# Patient Record
Sex: Male | Born: 1950 | Hispanic: No | Smoking: Never smoker
Health system: Southern US, Community
[De-identification: ages and names within clinical notes are randomized; demographics above are authoritative.]

## PROBLEM LIST (undated history)

## (undated) DIAGNOSIS — E785 Hyperlipidemia, unspecified: Secondary | ICD-10-CM

## (undated) DIAGNOSIS — I10 Essential (primary) hypertension: Secondary | ICD-10-CM

## (undated) DIAGNOSIS — N400 Enlarged prostate without lower urinary tract symptoms: Secondary | ICD-10-CM

## (undated) DIAGNOSIS — E039 Hypothyroidism, unspecified: Secondary | ICD-10-CM

## (undated) DIAGNOSIS — M1990 Unspecified osteoarthritis, unspecified site: Secondary | ICD-10-CM

## (undated) HISTORY — DX: Unspecified osteoarthritis, unspecified site: M19.90

## (undated) HISTORY — PX: CARPAL TUNNEL RELEASE: SHX101

## (undated) HISTORY — DX: Hyperlipidemia, unspecified: E78.5

---

## 2000-04-04 ENCOUNTER — Encounter: Payer: Self-pay | Admitting: Emergency Medicine

## 2000-04-04 ENCOUNTER — Emergency Department (HOSPITAL_COMMUNITY): Admission: EM | Admit: 2000-04-04 | Discharge: 2000-04-04 | Payer: Self-pay | Admitting: Emergency Medicine

## 2005-05-06 ENCOUNTER — Ambulatory Visit: Payer: Self-pay | Admitting: Internal Medicine

## 2017-09-12 ENCOUNTER — Encounter: Payer: Self-pay | Admitting: Physician Assistant

## 2017-09-12 ENCOUNTER — Ambulatory Visit (INDEPENDENT_AMBULATORY_CARE_PROVIDER_SITE_OTHER): Payer: Medicare Other | Admitting: Physician Assistant

## 2017-09-12 ENCOUNTER — Ambulatory Visit (INDEPENDENT_AMBULATORY_CARE_PROVIDER_SITE_OTHER): Payer: Medicare Other

## 2017-09-12 VITALS — BP 130/70 | HR 58 | Temp 98.0°F | Resp 16 | Ht 64.5 in | Wt 156.2 lb

## 2017-09-12 DIAGNOSIS — Z23 Encounter for immunization: Secondary | ICD-10-CM | POA: Diagnosis not present

## 2017-09-12 DIAGNOSIS — N5089 Other specified disorders of the male genital organs: Secondary | ICD-10-CM

## 2017-09-12 DIAGNOSIS — K5730 Diverticulosis of large intestine without perforation or abscess without bleeding: Secondary | ICD-10-CM | POA: Diagnosis not present

## 2017-09-12 DIAGNOSIS — E279 Disorder of adrenal gland, unspecified: Secondary | ICD-10-CM

## 2017-09-12 DIAGNOSIS — N2889 Other specified disorders of kidney and ureter: Secondary | ICD-10-CM | POA: Diagnosis not present

## 2017-09-12 DIAGNOSIS — E278 Other specified disorders of adrenal gland: Secondary | ICD-10-CM | POA: Insufficient documentation

## 2017-09-12 NOTE — Patient Instructions (Addendum)
You are being referred to [facility].  I expect that you will be seen within 30 days.  Please keep your phone one and check you messages so you get in touch with their office once they call.     IF you received an x-ray today, you will receive an invoice from [facility]. Please contact [facility] at [phone] with questions or concerns regarding your invoice.   IF you received labwork today, you will receive an invoice from [facility]. Please contact [facility] at [phone] with questions or concerns regarding your invoice.   Our billing staff will not be able to assist you with questions regarding bills from these companies.  You will be contacted with the lab results as soon as they are available. The fastest way to get your results is to activate your My Chart account. Instructions are located on the last page of this paperwork. If you have not heard from [place] regarding the results in 2 weeks, please contact this office.

## 2017-09-12 NOTE — Progress Notes (Signed)
    09/12/2017 1:45 PM   DOB: 07-10-1951 / MRN: [id number]  SUBJECTIVE:  Glenn Lewis is a 66 y.o. male presenting for renal mass.  New patient to the office and the are. No weight loss. Never smoker. Sister breast cancer. He is eating well. Negative chest xray in 10/2016. Previous ultrasounds show testicular cyst. See below.   He has No Known Allergies.   He  has no past medical history on file.    He  reports that he has quit smoking. He has never used smokeless tobacco. He reports that he drinks alcohol. He reports that he does not use drugs. He  has no sexual activity history on file. The patient  has no past surgical history on file.  His family history is not on file.  Review of Systems  Constitutional: Negative for chills, diaphoresis and fever.  Eyes: Negative.   Respiratory: Negative for cough, hemoptysis, sputum production, shortness of breath and wheezing.   Cardiovascular: Negative for chest pain, orthopnea and leg swelling.  Gastrointestinal: Negative for abdominal pain, blood in stool, constipation, diarrhea, heartburn, melena, nausea and vomiting.  Genitourinary: Negative for flank pain.  Skin: Negative for rash.  Neurological: Negative for dizziness, sensory change, speech change, focal weakness and headaches.    The problem list and medications were reviewed and updated by myself where necessary and exist elsewhere in the encounter.   OBJECTIVE:  BP 130/70 (BP Location: Right Arm, Patient Position: Sitting, Cuff Size: Normal)   Pulse (!) 58   Temp 98 F (36.7 C) (Oral)   Resp 16   Ht 5' 4.5" (1.638 m)   Wt 156 lb 3.2 oz (70.9 kg)   SpO2 96%   BMI 26.40 kg/m   Physical Exam  Constitutional: He appears well-developed. He is active and cooperative.  Non-toxic appearance.  Cardiovascular: Normal rate.   Pulmonary/Chest: Effort normal. No tachypnea.  Neurological: He is alert.  Skin: Skin is warm and dry. He is not diaphoretic. No pallor.  Vitals  reviewed.            No results found for this or any previous visit (from the past 72 hour(s)).  No results found.  ASSESSMENT AND PLAN:  Siyon was seen today for referral.  Diagnoses and all orders for this visit:  Need for prophylactic vaccination and inoculation against influenza -     Cancel: Flu Vaccine QUAD 36+ mos IM  Renal mass -     DG Chest 2 View; Future  Diverticulosis of colon without hemorrhage: Historical.   Adrenal mass [facility]): He will likely need a biopsy.  Will start with urology to get a diagnosis and will refer to endo iff needed.  -     CBC with Differential -     Urinalysis, dipstick only -     CMP and Liver    The patient is advised to call or return to clinic if he does not see an improvement in symptoms, or to seek the care of the closest emergency department if he worsens with the above plan.   Philis Fendt, MHS, PA-C [facility] 09/12/2017 1:45 PM

## 2017-09-13 LAB — CMP AND LIVER
ALT: 12 IU/L (ref 0–44)
AST: 14 IU/L (ref 0–40)
Albumin: 3.8 g/dL (ref 3.6–4.8)
Alkaline Phosphatase: 46 IU/L (ref 39–117)
BUN: 13 mg/dL (ref 8–27)
Bilirubin Total: <0.2 mg/dL (ref 0.0–1.2)
Bilirubin, Direct: 0.06 mg/dL (ref 0.00–0.40)
CO2: 24 mmol/L (ref 20–29)
Calcium: 9.3 mg/dL (ref 8.6–10.2)
Chloride: 98 mmol/L (ref 96–106)
Creatinine, Ser: 0.82 mg/dL (ref 0.76–1.27)
GFR calc Af Amer: 106 mL/min/{1.73_m2} (ref >59)
GFR calc non Af Amer: 92 mL/min/{1.73_m2} (ref >59)
Glucose: 114 mg/dL — ABNORMAL HIGH (ref 65–99)
Potassium: 5 mmol/L (ref 3.5–5.2)
Sodium: 138 mmol/L (ref 134–144)
Total Protein: 8.4 g/dL (ref 6.0–8.5)

## 2017-09-13 LAB — CBC WITH DIFFERENTIAL/PLATELET
Basophils Absolute: 0 10*3/uL (ref 0.0–0.2)
Basos: 0 %
EOS (ABSOLUTE): 0.4 10*3/uL (ref 0.0–0.4)
Eos: 6 %
Hematocrit: 38.3 % (ref 37.5–51.0)
Hemoglobin: 12.4 g/dL — ABNORMAL LOW (ref 13.0–17.7)
Immature Grans (Abs): 0 10*3/uL (ref 0.0–0.1)
Immature Granulocytes: 0 %
Lymphocytes Absolute: 2.2 10*3/uL (ref 0.7–3.1)
Lymphs: 36 %
MCH: 30.4 pg (ref 26.6–33.0)
MCHC: 32.4 g/dL (ref 31.5–35.7)
MCV: 94 fL (ref 79–97)
Monocytes Absolute: 0.7 10*3/uL (ref 0.1–0.9)
Monocytes: 12 %
Neutrophils Absolute: 2.8 10*3/uL (ref 1.4–7.0)
Neutrophils: 46 %
Platelets: 243 10*3/uL (ref 150–379)
RBC: 4.08 x10E6/uL — ABNORMAL LOW (ref 4.14–5.80)
RDW: 15 % (ref 12.3–15.4)
WBC: 6 10*3/uL (ref 3.4–10.8)

## 2017-10-27 ENCOUNTER — Ambulatory Visit (INDEPENDENT_AMBULATORY_CARE_PROVIDER_SITE_OTHER): Payer: Medicare Other

## 2017-10-27 ENCOUNTER — Encounter: Payer: Self-pay | Admitting: Physician Assistant

## 2017-10-27 ENCOUNTER — Ambulatory Visit (INDEPENDENT_AMBULATORY_CARE_PROVIDER_SITE_OTHER): Payer: Medicare Other | Admitting: Physician Assistant

## 2017-10-27 VITALS — BP 126/72 | HR 57 | Temp 98.4°F | Resp 18 | Ht 65.98 in | Wt 161.8 lb

## 2017-10-27 DIAGNOSIS — R059 Cough, unspecified: Secondary | ICD-10-CM

## 2017-10-27 DIAGNOSIS — R05 Cough: Secondary | ICD-10-CM

## 2017-10-27 DIAGNOSIS — R0981 Nasal congestion: Secondary | ICD-10-CM

## 2017-10-27 DIAGNOSIS — R062 Wheezing: Secondary | ICD-10-CM

## 2017-10-27 DIAGNOSIS — J9801 Acute bronchospasm: Secondary | ICD-10-CM

## 2017-10-27 LAB — POCT CBC
GRANULOCYTE PERCENT: 54.6 % (ref 37–80)
HCT, POC: 39.5 % — AB (ref 43.5–53.7)
HEMOGLOBIN: 13.5 g/dL — AB (ref 14.1–18.1)
Lymph, poc: 1.9 (ref 0.6–3.4)
MCH: 31.7 pg — AB (ref 27–31.2)
MCHC: 34.2 g/dL (ref 31.8–35.4)
MCV: 92.8 fL (ref 80–97)
MID (cbc): 0.4 (ref 0–0.9)
MPV: 8.1 fL (ref 0–99.8)
PLATELET COUNT, POC: 237 10*3/uL (ref 142–424)
POC Granulocyte: 2.8 (ref 2–6.9)
POC LYMPH PERCENT: 36.9 %L (ref 10–50)
POC MID %: 8.5 % (ref 0–12)
RBC: 4.25 M/uL — AB (ref 4.69–6.13)
RDW, POC: 13.9 %
WBC: 5.2 10*3/uL (ref 4.6–10.2)

## 2017-10-27 MED ORDER — CETIRIZINE HCL 10 MG PO TABS: 10.0000 mg | ORAL_TABLET | Freq: Every day | ORAL | 11 refills | Status: DC

## 2017-10-27 MED ORDER — ALBUTEROL SULFATE (2.5 MG/3ML) 0.083% IN NEBU
2.5000 mg | INHALATION_SOLUTION | Freq: Once | RESPIRATORY_TRACT | Status: AC
  Administered 2017-10-27: 2.5 mg via RESPIRATORY_TRACT

## 2017-10-27 MED ORDER — FLUTICASONE PROPIONATE 50 MCG/ACT NA SUSP: 2.0000 | Freq: Every day | NASAL | 0 refills | Status: DC

## 2017-10-27 MED ORDER — ALBUTEROL SULFATE HFA 108 (90 BASE) MCG/ACT IN AERS: 2.0000 | INHALATION_SPRAY | Freq: Four times a day (QID) | RESPIRATORY_TRACT | 0 refills | Status: DC | PRN

## 2017-10-27 MED ORDER — IPRATROPIUM BROMIDE 0.02 % IN SOLN
0.5000 mg | Freq: Once | RESPIRATORY_TRACT | Status: AC
  Administered 2017-10-27: 0.5 mg via RESPIRATORY_TRACT

## 2017-10-27 MED ORDER — PREDNISONE 20 MG PO TABS: 40.0000 mg | ORAL_TABLET | Freq: Every day | ORAL | 0 refills | Status: AC

## 2017-10-27 NOTE — Progress Notes (Signed)
Glenn Lewis  MRN: [id number] DOB: [birth year]  Subjective:  Glenn Lewis is a 66 y.o. male seen in office today for a chief complaint of gradually improving dry cough, which is worse in the morning. Will occasionally have heartburn after eating spicy.  Also endorses some nasal congestion. Has occasional wheezing. Denies SOB,hemoptysis, night sweats, chest pain, fever, chills, diaphoresis, sore throat, sinus pressure, sneezing, ear pain, and itchy watery eyes. Has tried OTC mucinex and mucinex with no relief. Denies smoking and alcohol use. Denies recent travel. No hx of asthma or COPD.  Of note patient did move here from [place] exactly 2 months ago and that is when the coughing and nasal congestion started.  Review of Systems  Constitutional: Negative for activity change and unexpected weight change.  HENT: Negative for trouble swallowing and voice change.   Cardiovascular: Negative for chest pain, palpitations and leg swelling.  Gastrointestinal: Negative for abdominal pain, nausea and vomiting.  Neurological: Negative for dizziness and light-headedness.     Patient Active Problem List   Diagnosis Date Noted  . Diverticulosis of colon without hemorrhage 09/12/2017  . Adrenal mass ([place]) 09/12/2017    Current Outpatient Medications on File Prior to Visit  Medication Sig Dispense Refill  . levothyroxine (SYNTHROID, LEVOTHROID) 75 MCG tablet Take 75 mcg by mouth daily before breakfast.    . oxybutynin (DITROPAN-XL) 10 MG 24 hr tablet Take 10 mg by mouth at bedtime.    . tamsulosin (FLOMAX) 0.4 MG CAPS capsule Take 0.4 mg by mouth.     No current facility-administered medications on file prior to visit.     No Known Allergies    Social History   Socioeconomic History  . Marital status: Married    Spouse name: Not on file  . Number of children: Not on file  . Years of education: Not on file  . Highest education level: Not on file  Social Needs  . Financial resource  strain: Not on file  . Food insecurity - worry: Not on file  . Food insecurity - inability: Not on file  . Transportation needs - medical: Not on file  . Transportation needs - non-medical: Not on file  Occupational History  . Not on file  Tobacco Use  . Smoking status: Former Research scientist (life sciences)  . Smokeless tobacco: Never Used  Substance and Sexual Activity  . Alcohol use: Yes    Comment: sometimes  . Drug use: No  . Sexual activity: Not on file  Other Topics Concern  . Not on file  Social History Narrative  . Not on file    Objective:  BP 126/72 (BP Location: Left Arm, Patient Position: Sitting, Cuff Size: Normal)   Pulse (!) 57   Temp 98.4 F (36.9 C) (Oral)   Resp 18   Ht 5' 5.98" (1.676 m)   Wt 161 lb 12.8 oz (73.4 kg)   SpO2 99%   BMI 26.13 kg/m   Physical Exam  Constitutional: He is oriented to person, place, and time and well-developed, well-nourished, and in no distress.  HENT:  Head: Normocephalic and atraumatic.  Right Ear: Tympanic membrane, external ear and ear canal normal.  Left Ear: Tympanic membrane, external ear and ear canal normal.  Nose: Mucosal edema (moderate in right, severe in left) present. No rhinorrhea. Right sinus exhibits no maxillary sinus tenderness and no frontal sinus tenderness. Left sinus exhibits no maxillary sinus tenderness and no frontal sinus tenderness.  Mouth/Throat: Uvula is midline and mucous membranes are  normal. Posterior oropharyngeal erythema (cobblestoning noted) present.  Eyes: Conjunctivae are normal.  Neck: Normal range of motion.  Cardiovascular: Normal rate, regular rhythm and normal heart sounds.  Pulmonary/Chest: Effort normal. No respiratory distress. He has wheezes (diffuse intermittent). He has no rhonchi. He has no rales.  Lymphadenopathy:       Head (right side): No submental, no submandibular, no tonsillar, no preauricular, no posterior auricular and no occipital adenopathy present.       Head (left side): No submental,  no submandibular, no tonsillar, no preauricular, no posterior auricular and no occipital adenopathy present.    He has no cervical adenopathy.       Right: No supraclavicular adenopathy present.       Left: No supraclavicular adenopathy present.  Neurological: He is alert and oriented to person, place, and time. Gait normal.  Skin: Skin is warm and dry.  Psychiatric: Affect normal.  Vitals reviewed.  Wt Readings from Last 3 Encounters:  10/27/17 161 lb 12.8 oz (73.4 kg)  09/12/17 156 lb 3.2 oz (70.9 kg)    Results for orders placed or performed in visit on 10/27/17 (from the past 24 hour(s))  POCT CBC     Status: Abnormal   Collection Time: 10/27/17 10:00 AM  Result Value Ref Range   WBC 5.2 4.6 - 10.2 K/uL   Lymph, poc 1.9 0.6 - 3.4   POC LYMPH PERCENT 36.9 10 - 50 %L   MID (cbc) 0.4 0 - 0.9   POC MID % 8.5 0 - 12 %M   POC Granulocyte 2.8 2 - 6.9   Granulocyte percent 54.6 37 - 80 %G   RBC 4.25 (A) 4.69 - 6.13 M/uL   Hemoglobin 13.5 (A) 14.1 - 18.1 g/dL   HCT, POC 39.5 (A) 43.5 - 53.7 %   MCV 92.8 80 - 97 fL   MCH, POC 31.7 (A) 27 - 31.2 pg   MCHC 34.2 31.8 - 35.4 g/dL   RDW, POC 13.9 %   Platelet Count, POC 237 142 - 424 K/uL   MPV 8.1 0 - 99.8 fL   Dg Chest 2 View  Result Date: 10/27/2017 CLINICAL DATA:  Cough for 2 months, diffuse intermittent wheezing on exam, former smoker EXAM: CHEST  2 VIEW COMPARISON:  09/12/2017 FINDINGS: Normal heart size, mediastinal contours, and pulmonary vascularity. Lungs well inflated and clear. No acute infiltrate, pleural effusion or pneumothorax. Multilevel endplate spur formation thoracic spine. IMPRESSION: No acute abnormalities. Electronically Signed   By: Lavonia Dana M.D.   On: 10/27/2017 09:57   Wheezing improved after breathing treatment. Lungs CTAB.  Assessment and Plan :  1. Cough Plain films show no acute abnormalities.  WBC within normal limits.  Do not suggest bacterial etiology at this time. - DG Chest 2 View; Future - POCT  CBC  2. Wheezing Improved with breathing treatment. - albuterol (PROVENTIL) (2.5 MG/3ML) 0.083% nebulizer solution 2.5 mg - ipratropium (ATROVENT) nebulizer solution 0.5 mg  3. Bronchospasm History suspicious for irritant induced bronchospasm.  Patient moved here 2 months ago from [place] and that is when his symptoms began.  He has associated nasal congestion and physical exam findings are consistent with allergic rhinitis.  He has mucosal edema and cobblestoning in throat.  We will treat at this time with prednisone for wheezing.  Given Rx for albuterol inhaler to use every 4-6 hours as needed for wheezing.  Also given Rx for daily antihistamine and Flonase to use for possible underlying allergies. Return to  clinic if symptoms worsen, do not improve, or as needed.  4. Nasal congestion   Meds ordered this encounter  Medications  . albuterol (PROVENTIL) (2.5 MG/3ML) 0.083% nebulizer solution 2.5 mg  . ipratropium (ATROVENT) nebulizer solution 0.5 mg  . predniSONE (DELTASONE) 20 MG tablet    Sig: Take 2 tablets (40 mg total) daily with breakfast for 5 days by mouth.    Dispense:  10 tablet    Refill:  0    Order Specific Question:   Supervising Provider    Answer:   Wardell Honour [[id number]]  . albuterol (PROVENTIL HFA;VENTOLIN HFA) 108 (90 Base) MCG/ACT inhaler    Sig: Inhale 2 puffs every 6 (six) hours as needed into the lungs for wheezing or shortness of breath.    Dispense:  1 Inhaler    Refill:  0    Order Specific Question:   Supervising Provider    Answer:   Wardell Honour [[id number]]  . cetirizine (ZYRTEC) 10 MG tablet    Sig: Take 1 tablet (10 mg total) daily by mouth.    Dispense:  30 tablet    Refill:  11    Order Specific Question:   Supervising Provider    Answer:   Wardell Honour [[id number]]  . fluticasone (FLONASE) 50 MCG/ACT nasal spray    Sig: Place 2 sprays daily into both nostrils.    Dispense:  16 g    Refill:  0    Order Specific Question:   Supervising Provider     Answer:   Reginia Forts M [[id number]]    Tenna Delaine PA-C  [facility] [place] 10/27/2017 10:07 AM

## 2017-10-27 NOTE — Patient Instructions (Addendum)
Your cough is likely due to you having bronchospasms.  This could be due to irritants in the environment as you are also having nasal congestion.  I am going to treat your underlying inflammation in your lungs with prednisone.  I am going to treat any potential allergies with oral antihistamine and nasal steroid.  Please use as prescribed.  You may also use the albuterol inhaler every 4-6 hours as needed if you have any wheezing.  Prednisone is a steroid and can cause side effects such as headache, irritability, nausea, vomiting, increased heart rate, increased blood pressure, increased blood sugar, appetite changes, and insomnia. Please take tablets in the morning with a full meal to help decrease the chances of these side effects.   Return to clinic if symptoms worsen, do not improve in 7-10 days, or as needed.      IF you received an x-ray today, you will receive an invoice from [facility]. Please contact [facility] at [phone] with questions or concerns regarding your invoice.   IF you received labwork today, you will receive an invoice from [place]. Please contact [facility] at [phone] with questions or concerns regarding your invoice.   Our billing staff will not be able to assist you with questions regarding bills from these companies.  You will be contacted with the lab results as soon as they are available. The fastest way to get your results is to activate your My Chart account. Instructions are located on the last page of this paperwork. If you have not heard from [place] regarding the results in 2 weeks, please contact this office.

## 2017-12-30 ENCOUNTER — Other Ambulatory Visit: Payer: Self-pay | Admitting: Urology

## 2017-12-30 DIAGNOSIS — D3501 Benign neoplasm of right adrenal gland: Secondary | ICD-10-CM

## 2017-12-31 ENCOUNTER — Telehealth: Payer: Self-pay | Admitting: Physician Assistant

## 2017-12-31 NOTE — Telephone Encounter (Signed)
Called to remind pt about their appt tomorrow 01/01/18 °

## 2018-01-01 ENCOUNTER — Other Ambulatory Visit: Payer: Self-pay

## 2018-01-01 ENCOUNTER — Ambulatory Visit (INDEPENDENT_AMBULATORY_CARE_PROVIDER_SITE_OTHER): Payer: Medicare Other | Admitting: Physician Assistant

## 2018-01-01 ENCOUNTER — Encounter: Payer: Self-pay | Admitting: Physician Assistant

## 2018-01-01 VITALS — BP 110/64 | HR 79 | Temp 98.1°F | Resp 18 | Ht 65.98 in | Wt 168.0 lb

## 2018-01-01 DIAGNOSIS — E039 Hypothyroidism, unspecified: Secondary | ICD-10-CM

## 2018-01-01 DIAGNOSIS — R739 Hyperglycemia, unspecified: Secondary | ICD-10-CM

## 2018-01-01 LAB — GLUCOSE, POCT (MANUAL RESULT ENTRY): POC GLUCOSE: 116 mg/dL — AB (ref 70–99)

## 2018-01-01 NOTE — Progress Notes (Signed)
Glenn Lewis  MRN: [id number] DOB: 05/24/1951  Subjective:  Glenn Lewis is a 67 y.o. male seen in office today for a chief complaint of lab check.  Patient has history of hypothyroidism about 4 years ago.  Was on Synthroid 75 mcg daily.  Has not taken this in about 2 months.  He is wondering if he still needs to be on medication.  There are no values of TSH in our system.  Denies fatigue, hair loss, dry skin, chest pain, heart palpitations, heat or cold intolerance.  He does have some mild constipation.  Notes he used to have a bowel movement every day but now only has 1 every couple days.  He would also like to have his sugars checked because he has been told the past that he has elevated sugars.  He is fasting today.  He is not sure if he has a family history of diabetes because he does not talk with his family.  He denies dry mouth, hot urea, polydipsia, polyphagia, blurred vision, abdominal pain, nausea, vomiting, and foot paresthesias.  In terms of diet, he notes he eats lots of vegetables and fruits.  Eats mainly chicken  As a source of meat.  Drinks mostly water.  Avoid sodas and sweet teas.  In terms of cholesterol, he does know he has had high cholesterol in the past but cannot remember his values.  He tries to avoid excess fatty and sugary foods.  Denies smoking and alcohol use.  Has no other questions or complaints today.  Review of Systems  Per HPI  Patient Active Problem List   Diagnosis Date Noted  . Diverticulosis of colon without hemorrhage 09/12/2017  . Adrenal mass ([place]) 09/12/2017    Current Outpatient Medications on File Prior to Visit  Medication Sig Dispense Refill  . albuterol (PROVENTIL HFA;VENTOLIN HFA) 108 (90 Base) MCG/ACT inhaler Inhale 2 puffs every 6 (six) hours as needed into the lungs for wheezing or shortness of breath. 1 Inhaler 0  . levothyroxine (SYNTHROID, LEVOTHROID) 75 MCG tablet Take 75 mcg by mouth daily before breakfast.    . cetirizine (ZYRTEC)  10 MG tablet Take 1 tablet (10 mg total) daily by mouth. (Patient not taking: Reported on 01/01/2018) 30 tablet 11  . fluticasone (FLONASE) 50 MCG/ACT nasal spray Place 2 sprays daily into both nostrils. (Patient not taking: Reported on 01/01/2018) 16 g 0  . oxybutynin (DITROPAN-XL) 10 MG 24 hr tablet Take 10 mg by mouth at bedtime.    . tamsulosin (FLOMAX) 0.4 MG CAPS capsule Take 0.4 mg by mouth.     No current facility-administered medications on file prior to visit.     No Known Allergies   Objective:  BP 110/64   Pulse 79   Temp 98.1 F (36.7 C) (Oral)   Resp 18   Ht 5' 5.98" (1.676 m)   Wt 168 lb (76.2 kg)   SpO2 97%   BMI 27.13 kg/m   Physical Exam  Constitutional: He is oriented to person, place, and time and well-developed, well-nourished, and in no distress.  HENT:  Head: Normocephalic and atraumatic.  Mouth/Throat: Uvula is midline, oropharynx is clear and moist and mucous membranes are normal.  Eyes: Conjunctivae are normal.  Neck: Normal range of motion.  Cardiovascular: Normal rate, regular rhythm and normal heart sounds.  Pulmonary/Chest: Effort normal and breath sounds normal. He has no wheezes. He has no rhonchi. He has no rales.  Neurological: He is alert and oriented to  person, place, and time. Gait normal.  Skin: Skin is warm and dry.  Psychiatric: Affect normal.  Vitals reviewed.  Results for orders placed or performed in visit on 01/01/18 (from the past 24 hour(s))  POCT glucose (manual entry)     Status: Abnormal   Collection Time: 01/01/18  9:18 AM  Result Value Ref Range   POC Glucose 116 (A) 70 - 99 mg/dl     Assessment and Plan :  1. Hypothyroidism, unspecified type Labs pending.  Depending on TSH level, will determine if he needs refill for his Synthroid and if so what dose. - TSH - T3, Free - T4, Free - Glucose (CBG), Fasting - Lipid panel  2. Hyperglycemia Fasting glucose elevated at 116.  A1c pending.  Given educational material on  how to prevent type 2 diabetes. - Hemoglobin A1c - POCT glucose (manual entry)  Tenna Delaine PA-C  [facility] at [place] 01/01/2018 9:20 AM

## 2018-01-01 NOTE — Patient Instructions (Addendum)
Your glucose was elevated at 116, we have therefore added that additional lab I was talking about called an A1C to see if you are prediabetic or diabetic.  We should have that lab plus your thyroid labs back within a week and will contact you with these results.  Depending on your thyroid levels, we will decide if you need to continue with thyroid medication and at what dose.  At that time I will send in the prescription to your pharmacy if you still need to be on medication. Thank you for letting me participate in your health and well being.  Preventing Type 2 Diabetes Mellitus Type 2 diabetes (type 2 diabetes mellitus) is a long-term (chronic) disease that affects blood sugar (glucose) levels. Normally, a hormone called insulin allows glucose to enter cells in the body. The cells use glucose for energy. In type 2 diabetes, one or both of these problems may be present:  The body does not make enough insulin.  The body does not respond properly to insulin that it makes (insulin resistance).  Insulin resistance or lack of insulin causes excess glucose to build up in the blood instead of going into cells. As a result, high blood glucose (hyperglycemia) develops, which can cause many complications. Being overweight or obese and having an inactive (sedentary) lifestyle can increase your risk for diabetes. Type 2 diabetes can be delayed or prevented by making certain nutrition and lifestyle changes. What nutrition changes can be made?  Eat healthy meals and snacks regularly. Keep a healthy snack with you for when you get hungry between meals, such as fruit or a handful of nuts.  Eat lean meats and proteins that are low in saturated fats, such as chicken, fish, egg whites, and beans. Avoid processed meats.  Eat plenty of fruits and vegetables and plenty of grains that have not been processed (whole grains). It is recommended that you eat: ? 1?2 cups of fruit every day. ? 2?3 cups of vegetables every  day. ? 6?8 oz of whole grains every day, such as oats, whole wheat, bulgur, brown rice, quinoa, and millet.  Eat low-fat dairy products, such as milk, yogurt, and cheese.  Eat foods that contain healthy fats, such as nuts, avocado, olive oil, and canola oil.  Drink water throughout the day. Avoid drinks that contain added sugar, such as soda or sweet tea.  Follow instructions from your health care provider about specific eating or drinking restrictions.  Control how much food you eat at a time (portion size). ? Check food labels to find out the serving sizes of foods. ? Use a kitchen scale to weigh amounts of foods.  Saute or steam food instead of frying it. Cook with water or broth instead of oils or butter.  Limit your intake of: ? Salt (sodium). Have no more than 1 tsp (2,400 mg) of sodium a day. If you have heart disease or high blood pressure, have less than ? tsp (1,500 mg) of sodium a day. ? Saturated fat. This is fat that is solid at room temperature, such as butter or fat on meat. What lifestyle changes can be made?  Activity  Do moderate-intensity physical activity for at least 30 minutes on at least 5 days of the week, or as much as told by your health care provider.  Ask your health care provider what activities are safe for you. A mix of physical activities may be best, such as walking, swimming, cycling, and strength training.  Try to add  physical activity into your day. For example: ? Park in spots that are farther away than usual, so that you walk more. For example, park in a far corner of the parking lot when you go to the office or the grocery store. ? Take a walk during your lunch break. ? Use stairs instead of elevators or escalators. Weight Loss  Lose weight as directed. Your health care provider can determine how much weight loss is best for you and can help you lose weight safely.  If you are overweight or obese, you may be instructed to lose at least 5?7  % of your body weight. Alcohol and Tobacco   Limit alcohol intake to no more than 1 drink a day for nonpregnant women and 2 drinks a day for men. One drink equals 12 oz of beer, 5 oz of wine, or 1 oz of hard liquor.  Do not use any tobacco products, such as cigarettes, chewing tobacco, and e-cigarettes. If you need help quitting, ask your health care provider. Work With [place] Provider  Have your blood glucose tested regularly, as told by your health care provider.  Discuss your risk factors and how you can reduce your risk for diabetes.  Get screening tests as told by your health care provider. You may have screening tests regularly, especially if you have certain risk factors for type 2 diabetes.  Make an appointment with a diet and nutrition specialist (registered dietitian). A registered dietitian can help you make a healthy eating plan and can help you understand portion sizes and food labels. Why are these changes important?  It is possible to prevent or delay type 2 diabetes and related health problems by making lifestyle and nutrition changes.  It can be difficult to recognize signs of type 2 diabetes. The best way to avoid possible damage to your body is to take actions to prevent the disease before you develop symptoms. What can happen if changes are not made?  Your blood glucose levels may keep increasing. Having high blood glucose for a long time is dangerous. Too much glucose in your blood can damage your blood vessels, heart, kidneys, nerves, and eyes.  You may develop prediabetes or type 2 diabetes. Type 2 diabetes can lead to many chronic health problems and complications, such as: ? Heart disease. ? Stroke. ? Blindness. ? Kidney disease. ? Depression. ? Poor circulation in the feet and legs, which could lead to surgical removal (amputation) in severe cases. Where to find support:  Ask your health care provider to recommend a registered dietitian, diabetes  educator, or weight loss program.  Look for local or online weight loss groups.  Join a gym, fitness club, or outdoor activity group, such as a walking club. Where to find more information: To learn more about diabetes and diabetes prevention, visit:  American Diabetes Association (ADA): www.diabetes.[employer] of Diabetes and Digestive and Kidney Diseases: FindSpin.nl  To learn more about healthy eating, visit:  The U.S. Department of Agriculture Scientist, research (physical sciences)), Choose My Plate: http://wiley-williams.com/  Office of Disease Prevention and Health Promotion (ODPHP), Dietary Guidelines: SurferLive.at  Summary  You can reduce your risk for type 2 diabetes by increasing your physical activity, eating healthy foods, and losing weight as directed.  Talk with your health care provider about your risk for type 2 diabetes. Ask about any blood tests or screening tests that you need to have. This information is not intended to replace advice given to you by your health  care provider. Make sure you discuss any questions you have with your health care provider. Document Released: 04/01/2016 Document Revised: 05/16/2016 Document Reviewed: 01/30/2016 Elsevier Interactive Patient Education  2018 [employer].     IF you received an x-ray today, you will receive an invoice from [facility]. Please contact [facility] at [phone] with questions or concerns regarding your invoice.   IF you received labwork today, you will receive an invoice from [place]. Please contact [facility] at [phone] with questions or concerns regarding your invoice.   Our billing staff will not be able to assist you with questions regarding bills from these companies.  You will be contacted with the lab results as soon as they are available. The fastest way to get your results is to activate your My Chart account. Instructions are  located on the last page of this paperwork. If you have not heard from [place] regarding the results in 2 weeks, please contact this office.

## 2018-01-02 LAB — LIPID PANEL
CHOLESTEROL TOTAL: 276 mg/dL — AB (ref 100–199)
Chol/HDL Ratio: 4.8 ratio (ref 0.0–5.0)
HDL: 57 mg/dL (ref >39)
LDL Calculated: 191 mg/dL — ABNORMAL HIGH (ref 0–99)
Triglycerides: 140 mg/dL (ref 0–149)
VLDL CHOLESTEROL CAL: 28 mg/dL (ref 5–40)

## 2018-01-02 LAB — HEMOGLOBIN A1C
Est. average glucose Bld gHb Est-mCnc: 126 mg/dL
Hgb A1c MFr Bld: 6 % — ABNORMAL HIGH (ref 4.8–5.6)

## 2018-01-02 LAB — TSH: TSH: 3.19 u[IU]/mL (ref 0.450–4.500)

## 2018-01-02 LAB — T3, FREE: T3, Free: 2.9 pg/mL (ref 2.0–4.4)

## 2018-01-02 LAB — T4, FREE: FREE T4: 1.01 ng/dL (ref 0.82–1.77)

## 2018-01-07 ENCOUNTER — Ambulatory Visit (HOSPITAL_COMMUNITY)
Admission: RE | Admit: 2018-01-07 | Discharge: 2018-01-07 | Disposition: A | Discharge status: Home / Self Care | Payer: Medicare Other | Source: Ambulatory Visit | Attending: Urology | Admitting: Urology

## 2018-01-07 DIAGNOSIS — K7689 Other specified diseases of liver: Secondary | ICD-10-CM | POA: Insufficient documentation

## 2018-01-07 DIAGNOSIS — E279 Disorder of adrenal gland, unspecified: Secondary | ICD-10-CM | POA: Diagnosis not present

## 2018-01-07 DIAGNOSIS — N281 Cyst of kidney, acquired: Secondary | ICD-10-CM | POA: Insufficient documentation

## 2018-01-07 DIAGNOSIS — K5730 Diverticulosis of large intestine without perforation or abscess without bleeding: Secondary | ICD-10-CM | POA: Insufficient documentation

## 2018-01-07 DIAGNOSIS — D3501 Benign neoplasm of right adrenal gland: Secondary | ICD-10-CM | POA: Diagnosis not present

## 2018-01-07 MED ORDER — GADOBENATE DIMEGLUMINE 529 MG/ML IV SOLN
15.0000 mL | Freq: Once | INTRAVENOUS | Status: AC | PRN
  Administered 2018-01-07: 15 mL via INTRAVENOUS

## 2018-01-14 ENCOUNTER — Other Ambulatory Visit: Payer: Self-pay | Admitting: Physician Assistant

## 2018-07-06 ENCOUNTER — Ambulatory Visit (INDEPENDENT_AMBULATORY_CARE_PROVIDER_SITE_OTHER): Payer: Medicare Other | Admitting: Physician Assistant

## 2018-07-06 ENCOUNTER — Other Ambulatory Visit: Payer: Self-pay

## 2018-07-06 ENCOUNTER — Encounter: Payer: Self-pay | Admitting: Physician Assistant

## 2018-07-06 VITALS — BP 117/77 | HR 59 | Temp 98.4°F | Resp 18 | Ht 65.98 in | Wt 167.8 lb

## 2018-07-06 DIAGNOSIS — E785 Hyperlipidemia, unspecified: Secondary | ICD-10-CM | POA: Diagnosis not present

## 2018-07-06 DIAGNOSIS — Z1211 Encounter for screening for malignant neoplasm of colon: Secondary | ICD-10-CM

## 2018-07-06 DIAGNOSIS — R5383 Other fatigue: Secondary | ICD-10-CM

## 2018-07-06 DIAGNOSIS — R001 Bradycardia, unspecified: Secondary | ICD-10-CM

## 2018-07-06 DIAGNOSIS — Z9189 Other specified personal risk factors, not elsewhere classified: Secondary | ICD-10-CM

## 2018-07-06 LAB — POCT CBC
Granulocyte percent: 53.6 %G (ref 37–80)
HCT, POC: 40.8 % — AB (ref 43.5–53.7)
HEMOGLOBIN: 12.3 g/dL — AB (ref 14.1–18.1)
LYMPH, POC: 2.5 (ref 0.6–3.4)
MCH, POC: 28.2 pg (ref 27–31.2)
MCHC: 30.2 g/dL — AB (ref 31.8–35.4)
MCV: 93.4 fL (ref 80–97)
MID (cbc): 0.4 (ref 0–0.9)
MPV: 9 fL (ref 0–99.8)
PLATELET COUNT, POC: 228 10*3/uL (ref 142–424)
POC Granulocyte: 3.3 (ref 2–6.9)
POC LYMPH PERCENT: 40.4 %L (ref 10–50)
POC MID %: 6 %M (ref 0–12)
RBC: 4.36 M/uL — AB (ref 4.69–6.13)
RDW, POC: 13.5 %
WBC: 6.2 10*3/uL (ref 4.6–10.2)

## 2018-07-06 NOTE — Progress Notes (Signed)
07/07/2018 12:51 PM   DOB: 1951-01-24 / MRN: [id number]  SUBJECTIVE:  Glenn Lewis is a 67 y.o. male presenting for fatigue.  History of hypothyroidism and was taken off of his medication.  He would like those labs repeated today.  He is working 6 days a week at [employer] or so hours a day driving Surveyor, mining.  Tells me he gets up about 530, form some strength calisthenics and then works the rest of the day until he gets home at around 5 30-6 30, eats a light supper and then finds himself falling asleep at around 7 30-8 30 he feels this is abnormal for him.  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  He has a history of borderline anemia with stable CBC.  He denies any blood in the stool and had a colonoscopy about 2 years ago.  He was advised to return in 2 years for surveillance.  He tells me he is eating well and not losing weight.  He does not complain of night sweats.  He has been exercising less due to his aforementioned work schedule.  He has No Known Allergies.   He  has no past medical history on file.    He  reports that he has quit smoking. He has never used smokeless tobacco. He reports that he drinks alcohol. He reports that he does not use drugs. He  has no sexual activity history on file. The patient  has no past surgical history on file.  His family history includes Hypertension in his mother.  Review of Systems  Constitutional: Negative for chills, diaphoresis and fever.  Gastrointestinal: Negative for nausea.  Skin: Negative for rash.  Neurological: Negative for dizziness.  Psychiatric/Behavioral: Negative for depression, hallucinations, memory loss, substance abuse and suicidal ideas. The patient is not nervous/anxious and does not have insomnia.     The problem list and medications were reviewed and updated by myself where necessary and exist elsewhere in the encounter.   OBJECTIVE:  BP 117/77   Pulse (!) 59   Temp 98.4 F (36.9 C) (Oral)   Resp  18   Ht 5' 5.98" (1.676 m)   Wt 167 lb 12.8 oz (76.1 kg)   SpO2 97%   BMI 27.10 kg/m   Wt Readings from Last 3 Encounters:  07/06/18 167 lb 12.8 oz (76.1 kg)  01/01/18 168 lb (76.2 kg)  10/27/17 161 lb 12.8 oz (73.4 kg)   Temp Readings from Last 3 Encounters:  07/06/18 98.4 F (36.9 C) (Oral)  01/01/18 98.1 F (36.7 C) (Oral)  10/27/17 98.4 F (36.9 C) (Oral)   BP Readings from Last 3 Encounters:  07/06/18 117/77  01/01/18 110/64  10/27/17 126/72   Pulse Readings from Last 3 Encounters:  07/06/18 (!) 59  01/01/18 79  10/27/17 (!) 57    Physical Exam  Constitutional: He is oriented to person, place, and time. He appears well-developed. He is active.  Non-toxic appearance. He does not appear ill.  HENT:  Right Ear: Hearing, tympanic membrane, external ear and ear canal normal.  Left Ear: Hearing, tympanic membrane, external ear and ear canal normal.  Nose: Nose normal. Right sinus exhibits no maxillary sinus tenderness and no frontal sinus tenderness. Left sinus exhibits no maxillary sinus tenderness and no frontal sinus tenderness.  Mouth/Throat: Uvula is midline, oropharynx is clear and moist and mucous membranes are normal. No oropharyngeal exudate, posterior oropharyngeal edema or tonsillar abscesses.  Eyes: Pupils are equal, round, and reactive to light. Conjunctivae and EOM are normal.  Cardiovascular: Normal rate, regular rhythm, S1 normal, S2 normal, normal heart sounds, intact distal pulses and normal pulses. Exam reveals no gallop and no friction rub.  No murmur heard. Pulmonary/Chest: Effort normal. No stridor. No respiratory distress. He has no wheezes. He has no rales.  Abdominal: He exhibits no distension.  Musculoskeletal: Normal range of motion. He exhibits no edema.  Lymphadenopathy:       Head (right side): No submandibular and no tonsillar adenopathy present.       Head (left side): No submandibular and no tonsillar adenopathy present.    He has no  cervical adenopathy.  Neurological: He is alert and oriented to person, place, and time. No cranial nerve deficit. Coordination normal.  Skin: Skin is warm and dry. He is not diaphoretic. No pallor.  Psychiatric: He has a normal mood and affect.  Nursing note and vitals reviewed.   Lab Results  Component Value Date   HGBA1C 6.0 (H) 01/01/2018    Lab Results  Component Value Date   WBC 6.2 07/06/2018   HGB 12.3 (A) 07/06/2018   HCT 40.8 (A) 07/06/2018   MCV 93.4 07/06/2018   PLT 243 09/12/2017    Lab Results  Component Value Date   CREATININE 0.82 09/12/2017   BUN 13 09/12/2017   NA 138 09/12/2017   K 5.0 09/12/2017   CL 98 09/12/2017   CO2 24 09/12/2017    Lab Results  Component Value Date   ALT 12 09/12/2017   AST 14 09/12/2017   ALKPHOS 46 09/12/2017   BILITOT <0.2 09/12/2017    Lab Results  Component Value Date   TSH 4.170 07/06/2018    Lab Results  Component Value Date   CHOL 276 (H) 01/01/2018   HDL 57 01/01/2018   LDLCALC 191 (H) 01/01/2018   TRIG 140 01/01/2018   CHOLHDL 4.8 01/01/2018   CBC Latest Ref Rng & Units 07/06/2018 10/27/2017 09/12/2017  WBC 4.6 - 10.2 K/uL 6.2 5.2 6.0  Hemoglobin 14.1 - 18.1 g/dL 12.3(A) 13.5(A) 12.4(L)  Hematocrit 43.5 - 53.7 % 40.8(A) 39.5(A) 38.3  Platelets 150 - 379 x10E3/uL - - 243   The 10-year ASCVD risk score Glenn Bussing DC Jr., et al., 2013) is: 14.4%   Values used to calculate the score:     Age: 57 years     Sex: Male     Is Non-Hispanic African American: No     Diabetic: No     Tobacco smoker: No     Systolic Blood Pressure: 536 mmHg     Is BP treated: No     HDL Cholesterol: 57 mg/dL     Total Cholesterol: 276 mg/dL  Lab Results  Component Value Date   CHOL 276 (H) 01/01/2018   HDL 57 01/01/2018   LDLCALC 191 (H) 01/01/2018   TRIG 140 01/01/2018   CHOLHDL 4.8 01/01/2018     ASSESSMENT AND PLAN:  Glenn Lewis was seen today for bloodwork.  Diagnoses and all orders for this visit:  Bradycardia -      POCT CBC -     TSH -     Iron, TIBC and Ferritin Panel -     Testosterone; Future  Special screening for malignant neoplasms, colon Comments: Patient due back for his repeat colonoscopy about this time per chart review.  He is willing to get.  Will refer. Orders: -     Ambulatory referral to  Gastroenterology  At risk for acute ischemic cardiac event Comments: Patient with an ASCVD risk of roughly 14%.  Was advised by PA Timmothy Euler to start cholesterol medicine and she did prescribe.  Repeat labs today. Orders: -     Lipid Panel; Future  Dyslipidemia Comments: Patient is not taking statin at this time.  Repeating labs. Orders: -     Lipid Panel; Future  Fatigue, unspecified type -     TSH    The patient is advised to call or return to clinic if he does not see an improvement in symptoms, or to seek the care of the closest emergency department if he worsens with the above plan.   Philis Fendt, MHS, PA-C [facility] 07/07/2018 12:51 PM

## 2018-07-06 NOTE — Patient Instructions (Addendum)
  Get some cardiovascular exericse in at least three times a week.    IF you received an x-ray today, you will receive an invoice from [facility]. Please contact [facility] at [phone] with questions or concerns regarding your invoice.   IF you received labwork today, you will receive an invoice from [place]. Please contact [facility] at [phone] with questions or concerns regarding your invoice.   Our billing staff will not be able to assist you with questions regarding bills from these companies.  You will be contacted with the lab results as soon as they are available. The fastest way to get your results is to activate your My Chart account. Instructions are located on the last page of this paperwork. If you have not heard from [place] regarding the results in 2 weeks, please contact this office.

## 2018-07-07 ENCOUNTER — Ambulatory Visit: Payer: Medicare Other | Admitting: Physician Assistant

## 2018-07-07 DIAGNOSIS — E785 Hyperlipidemia, unspecified: Secondary | ICD-10-CM

## 2018-07-07 DIAGNOSIS — R001 Bradycardia, unspecified: Secondary | ICD-10-CM

## 2018-07-07 DIAGNOSIS — Z9189 Other specified personal risk factors, not elsewhere classified: Secondary | ICD-10-CM

## 2018-07-07 LAB — IRON,TIBC AND FERRITIN PANEL
FERRITIN: 207 ng/mL (ref 30–400)
IRON SATURATION: 32 % (ref 15–55)
IRON: 104 ug/dL (ref 38–169)
TIBC: 322 ug/dL (ref 250–450)
UIBC: 218 ug/dL (ref 111–343)

## 2018-07-07 LAB — TSH: TSH: 4.17 u[IU]/mL (ref 0.450–4.500)

## 2018-07-07 NOTE — Progress Notes (Signed)
Pt came for a lab only visit. Pt was not seen by provider

## 2018-07-08 LAB — LIPID PANEL
CHOL/HDL RATIO: 5.1 ratio — AB (ref 0.0–5.0)
CHOLESTEROL TOTAL: 248 mg/dL — AB (ref 100–199)
HDL: 49 mg/dL (ref >39)
LDL CALC: 176 mg/dL — AB (ref 0–99)
Triglycerides: 117 mg/dL (ref 0–149)
VLDL Cholesterol Cal: 23 mg/dL (ref 5–40)

## 2018-07-08 LAB — TESTOSTERONE: Testosterone: 556 ng/dL (ref 264–916)

## 2018-07-09 ENCOUNTER — Other Ambulatory Visit: Payer: Self-pay | Admitting: Physician Assistant

## 2018-07-09 MED ORDER — ROSUVASTATIN CALCIUM 5 MG PO TABS: 5.0000 mg | ORAL_TABLET | Freq: Every day | ORAL | 3 refills | Status: DC

## 2018-12-23 HISTORY — PX: OTHER SURGICAL HISTORY: SHX169

## 2019-01-17 IMAGING — DX DG CHEST 2V
2 series · 2 of 2 positions shown · non-contrast
Comparison: None.

CLINICAL DATA: Adrenal mass as well as kidney mass. Screening for
metastatic disease.

EXAM:
CHEST  2 VIEW

[chest pa]
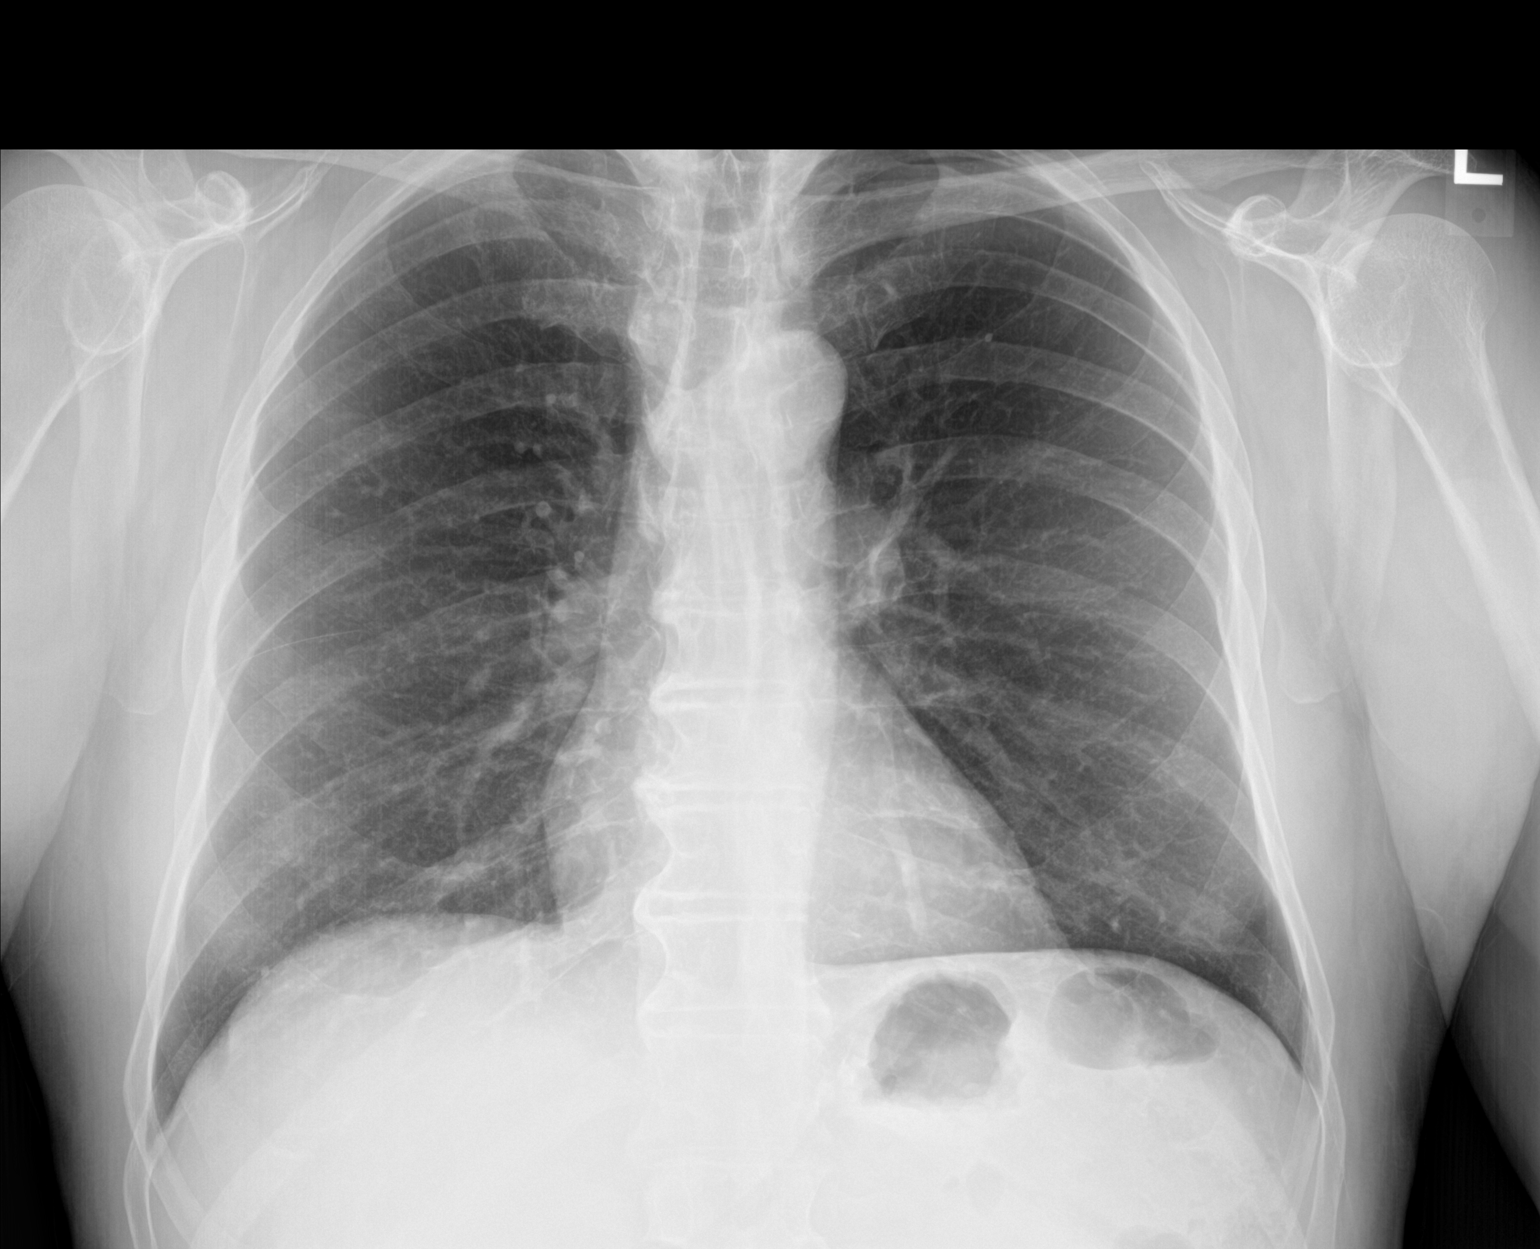

[chest lat]
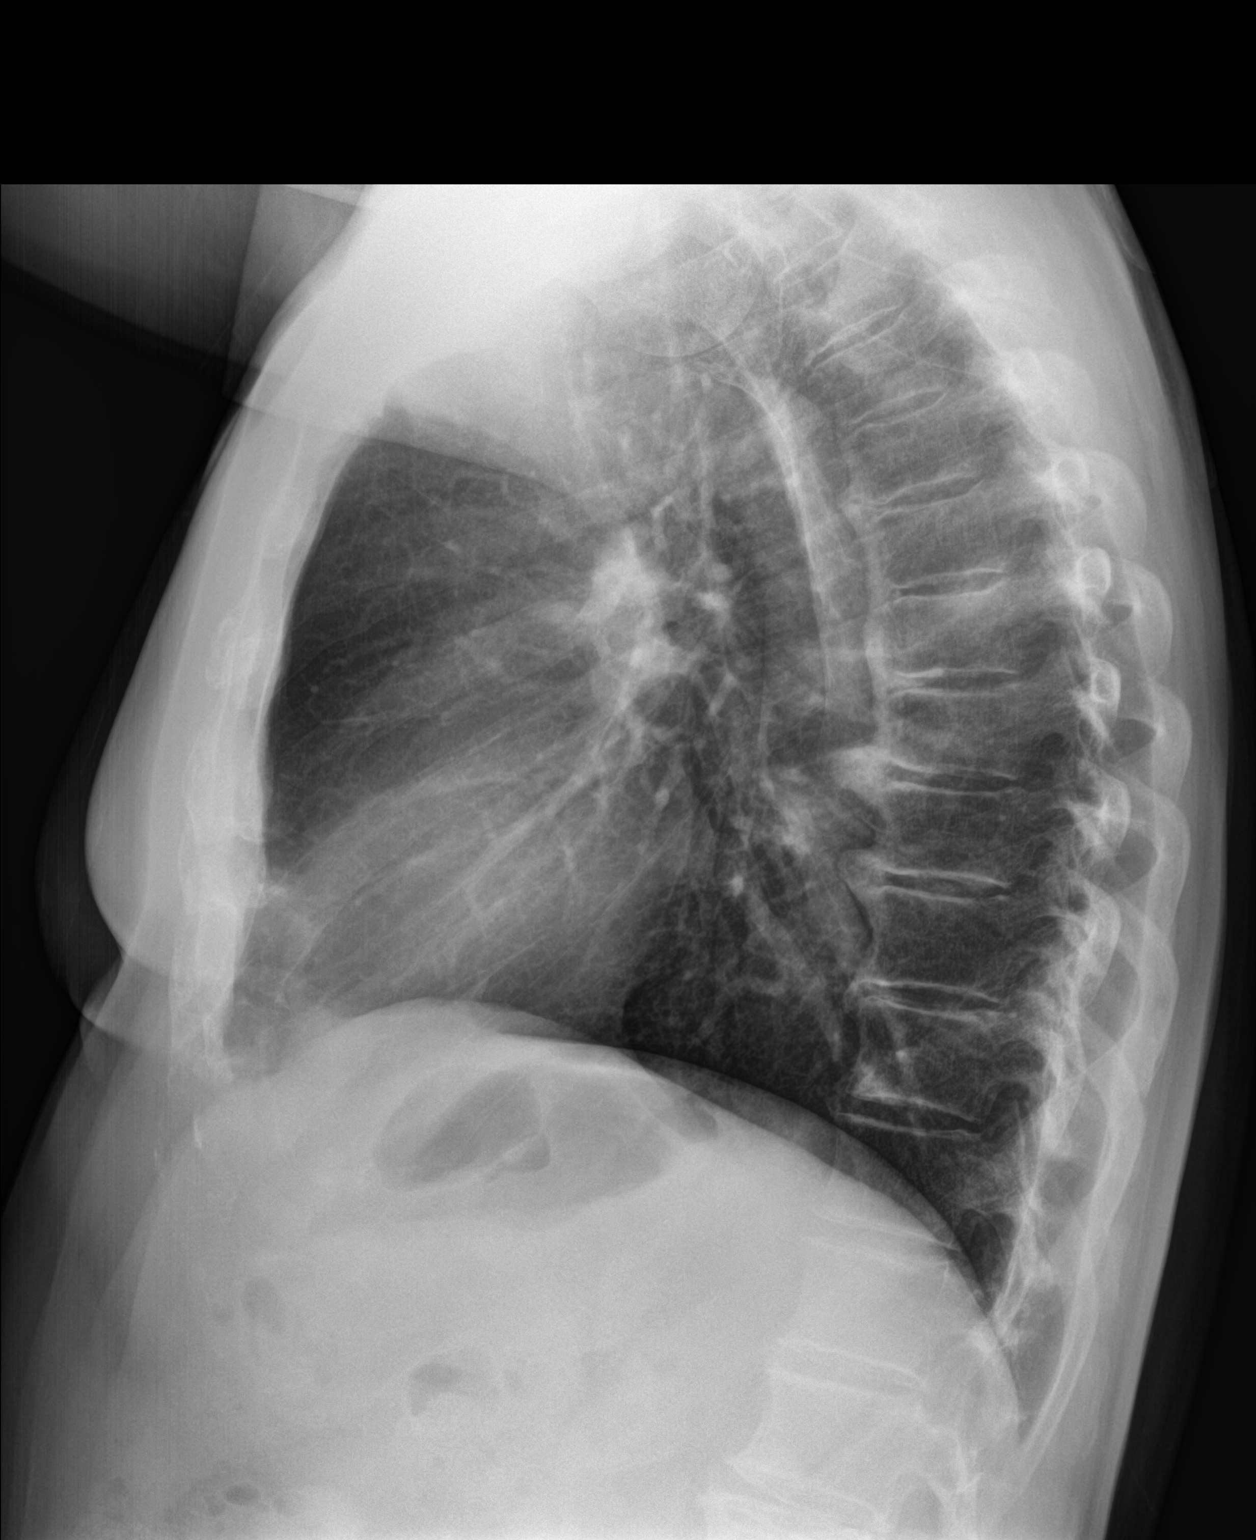

[2 of 2 positions shown; findings below may reference images not displayed]

FINDINGS: Lungs are clear. Cardiomediastinal silhouette is normal. Mild
degenerate change of the spine.
IMPRESSION: No active cardiopulmonary disease.

## 2019-10-06 ENCOUNTER — Other Ambulatory Visit: Payer: Self-pay

## 2019-10-06 ENCOUNTER — Ambulatory Visit (INDEPENDENT_AMBULATORY_CARE_PROVIDER_SITE_OTHER): Payer: Medicare Other | Admitting: Registered Nurse

## 2019-10-06 ENCOUNTER — Encounter: Payer: Self-pay | Admitting: Registered Nurse

## 2019-10-06 VITALS — BP 129/77 | HR 77 | Temp 98.3°F | Resp 16 | Ht 65.16 in | Wt 159.0 lb

## 2019-10-06 DIAGNOSIS — Z13220 Encounter for screening for lipoid disorders: Secondary | ICD-10-CM | POA: Diagnosis not present

## 2019-10-06 DIAGNOSIS — Z1329 Encounter for screening for other suspected endocrine disorder: Secondary | ICD-10-CM | POA: Diagnosis not present

## 2019-10-06 DIAGNOSIS — R5383 Other fatigue: Secondary | ICD-10-CM | POA: Diagnosis not present

## 2019-10-06 DIAGNOSIS — Z130 Encounter for screening for diseases of the blood and blood-forming organs and certain disorders involving the immune mechanism: Secondary | ICD-10-CM

## 2019-10-06 DIAGNOSIS — R350 Frequency of micturition: Secondary | ICD-10-CM

## 2019-10-06 DIAGNOSIS — Z13228 Encounter for screening for other metabolic disorders: Secondary | ICD-10-CM

## 2019-10-06 DIAGNOSIS — Z0000 Encounter for general adult medical examination without abnormal findings: Secondary | ICD-10-CM

## 2019-10-06 LAB — POCT URINALYSIS DIP (MANUAL ENTRY)
Bilirubin, UA: NEGATIVE
Blood, UA: NEGATIVE
Glucose, UA: NEGATIVE mg/dL
Ketones, POC UA: NEGATIVE mg/dL
Leukocytes, UA: NEGATIVE
Nitrite, UA: NEGATIVE
Protein Ur, POC: 30 mg/dL — AB
Spec Grav, UA: 1.02 (ref 1.010–1.025)
Urobilinogen, UA: 0.2 E.U./dL
pH, UA: 7 (ref 5.0–8.0)

## 2019-10-06 NOTE — Patient Instructions (Addendum)
   If you have lab work done today you will be contacted with your lab results within the next 2 weeks.  If you have not heard from us then please contact us. The fastest way to get your results is to register for My Chart.   IF you received an x-ray today, you will receive an invoice from [facility]. Please contact [facility] at [phone] with questions or concerns regarding your invoice.   IF you received labwork today, you will receive an invoice from [facility]. Please contact [facility] at [phone] with questions or concerns regarding your invoice.   Our billing staff will not be able to assist you with questions regarding bills from these companies.  You will be contacted with the lab results as soon as they are available. The fastest way to get your results is to activate your My Chart account. Instructions are located on the last page of this paperwork. If you have not heard from us regarding the results in 2 weeks, please contact this office.       Health Maintenance, Male Adopting a healthy lifestyle and getting preventive care are important in promoting health and wellness. Ask your health care provider about:  The right schedule for you to have regular tests and exams.  Things you can do on your own to prevent diseases and keep yourself healthy. What should I know about diet, weight, and exercise? Eat a healthy diet   Eat a diet that includes plenty of vegetables, fruits, low-fat dairy products, and lean protein.  Do not eat a lot of foods that are high in solid fats, added sugars, or sodium. Maintain a healthy weight Body mass index (BMI) is a measurement that can be used to identify possible weight problems. It estimates body fat based on height and weight. Your health care provider can help determine your BMI and help you achieve or maintain a healthy weight. Get regular exercise Get regular exercise. This is one of the most important things  you can do for your health. Most adults should:  Exercise for at least 150 minutes each week. The exercise should increase your heart rate and make you sweat (moderate-intensity exercise).  Do strengthening exercises at least twice a week. This is in addition to the moderate-intensity exercise.  Spend less time sitting. Even light physical activity can be beneficial. Watch cholesterol and blood lipids Have your blood tested for lipids and cholesterol at 69 years of age, then have this test every 5 years. You may need to have your cholesterol levels checked more often if:  Your lipid or cholesterol levels are high.  You are older than 69 years of age.  You are at high risk for heart disease. What should I know about cancer screening? Many types of cancers can be detected early and may often be prevented. Depending on your health history and family history, you may need to have cancer screening at various ages. This may include screening for:  Colorectal cancer.  Prostate cancer.  Skin cancer.  Lung cancer. What should I know about heart disease, diabetes, and high blood pressure? Blood pressure and heart disease  High blood pressure causes heart disease and increases the risk of stroke. This is more likely to develop in people who have high blood pressure readings, are of African descent, or are overweight.  Talk with your health care provider about your target blood pressure readings.  Have your blood pressure checked: ? Every 3-5 years if you are   18-39 years of age. ? Every year if you are 40 years old or older.  If you are between the ages of 65 and 75 and are a current or former smoker, ask your health care provider if you should have a one-time screening for abdominal aortic aneurysm (AAA). Diabetes Have regular diabetes screenings. This checks your fasting blood sugar level. Have the screening done:  Once every three years after age 45 if you are at a normal weight and  have a low risk for diabetes.  More often and at a younger age if you are overweight or have a high risk for diabetes. What should I know about preventing infection? Hepatitis B If you have a higher risk for hepatitis B, you should be screened for this virus. Talk with your health care provider to find out if you are at risk for hepatitis B infection. Hepatitis C Blood testing is recommended for:  Everyone born from 1945 through 1965.  Anyone with known risk factors for hepatitis C. Sexually transmitted infections (STIs)  You should be screened each year for STIs, including gonorrhea and chlamydia, if: ? You are sexually active and are younger than 69 years of age. ? You are older than 69 years of age and your health care provider tells you that you are at risk for this type of infection. ? Your sexual activity has changed since you were last screened, and you are at increased risk for chlamydia or gonorrhea. Ask your health care provider if you are at risk.  Ask your health care provider about whether you are at high risk for HIV. Your health care provider may recommend a prescription medicine to help prevent HIV infection. If you choose to take medicine to prevent HIV, you should first get tested for HIV. You should then be tested every 3 months for as long as you are taking the medicine. Follow these instructions at home: Lifestyle  Do not use any products that contain nicotine or tobacco, such as cigarettes, e-cigarettes, and chewing tobacco. If you need help quitting, ask your health care provider.  Do not use street drugs.  Do not share needles.  Ask your health care provider for help if you need support or information about quitting drugs. Alcohol use  Do not drink alcohol if your health care provider tells you not to drink.  If you drink alcohol: ? Limit how much you have to 0-2 drinks a day. ? Be aware of how much alcohol is in your drink. In the [place] one drink equals  one 12 oz bottle of beer (355 mL), one 5 oz glass of wine (148 mL), or one 1 oz glass of hard liquor (44 mL). General instructions  Schedule regular health, dental, and eye exams.  Stay current with your vaccines.  Tell your health care provider if: ? You often feel depressed. ? You have ever been abused or do not feel safe at home. Summary  Adopting a healthy lifestyle and getting preventive care are important in promoting health and wellness.  Follow your health care provider's instructions about healthy diet, exercising, and getting tested or screened for diseases.  Follow your health care provider's instructions on monitoring your cholesterol and blood pressure. This information is not intended to replace advice given to you by your health care provider. Make sure you discuss any questions you have with your health care provider. Document Released: 06/06/2008 Document Revised: 12/02/2018 Document Reviewed: 12/02/2018 Elsevier Patient Education  2020 [employer]     Colonoscopy, Adult A colonoscopy is an exam to look at the entire large intestine. During the exam, a lubricated, flexible tube that has a camera on the end of it is inserted into the anus and then passed into the rectum, colon, and other parts of the large intestine. You may have a colonoscopy as a part of normal colorectal screening or if you have certain symptoms, such as:  Lack of red blood cells (anemia).  Diarrhea that does not go away.  Abdominal pain.  Blood in your stool (feces). A colonoscopy can help screen for and diagnose medical problems, including:  Tumors.  Polyps.  Inflammation.  Areas of bleeding. Tell a health care provider about:  Any allergies you have.  All medicines you are taking, including vitamins, herbs, eye drops, creams, and over-the-counter medicines.  Any problems you or family members have had with anesthetic medicines.  Any blood disorders you have.  Any surgeries  you have had.  Any medical conditions you have.  Any problems you have had passing stool. What are the risks? Generally, this is a safe procedure. However, problems may occur, including:  Bleeding.  A tear in the intestine.  A reaction to medicines given during the exam.  Infection (rare). What happens before the procedure? Eating and drinking restrictions Follow instructions from your health care provider about eating and drinking, which may include:  A few days before the procedure - follow a low-fiber diet. Avoid nuts, seeds, dried fruit, raw fruits, and vegetables.  1-3 days before the procedure - follow a clear liquid diet. Drink only clear liquids, such as clear broth or bouillon, black coffee or tea, clear juice, clear soft drinks or sports drinks, gelatin dessert, and popsicles. Avoid any liquids that contain red or purple dye.  On the day of the procedure - do not eat or drink anything starting 2 hours before the procedure, or within the time period that your health care provider recommends. Up to 2 hours before the procedure, you may continue to drink clear liquids, such as water or clear fruit juice. Bowel prep If you were prescribed an oral bowel prep to clean out your colon:  Take it as told by your health care provider. Starting the day before your procedure, you will need to drink a large amount of medicated liquid. The liquid will cause you to have multiple loose stools until your stool is almost clear or light green.  If your skin or anus gets irritated from diarrhea, you may use these to relieve the irritation: ? Medicated wipes, such as adult wet wipes with aloe and vitamin E. ? A skin-soothing product like petroleum jelly.  If you vomit while drinking the bowel prep, take a break for up to 60 minutes and then begin the bowel prep again. If vomiting continues and you cannot take the bowel prep without vomiting, call your health care provider.  To clean out your  colon, you may also be given: ? Laxative medicines. ? Instructions about how to use an enema. General instructions  Ask your health care provider about: ? Changing or stopping your regular medicines or supplements. This is especially important if you are taking iron supplements, diabetes medicines, or blood thinners. ? Taking medicines such as aspirin and ibuprofen. These medicines can thin your blood. Do not take these medicines before the procedure if your health care provider tells you not to.  Plan to have someone take you home from the hospital or clinic. What happens during the   procedure?   An IV may be inserted into one of your veins.  You will be given medicine to help you relax (sedative).  To reduce your risk of infection: ? Your health care team will wash or sanitize their hands. ? Your anal area will be washed with soap.  You will be asked to lie on your side with your knees bent.  Your health care provider will lubricate a long, thin, flexible tube. The tube will have a camera and a light on the end.  The tube will be inserted into your anus.  The tube will be gently eased through your rectum and colon.  Air will be delivered into your colon to keep it open. You may feel some pressure or cramping.  The camera will be used to take images during the procedure.  A small tissue sample may be removed to be examined under a microscope (biopsy).  If small polyps are found, your health care provider may remove them and have them checked for cancer cells.  When the exam is done, the tube will be removed. The procedure may vary among health care providers and hospitals. What happens after the procedure?  Your blood pressure, heart rate, breathing rate, and blood oxygen level will be monitored until the medicines you were given have worn off.  Do not drive for 24 hours after the exam.  You may have a small amount of blood in your stool.  You may pass gas and have mild  abdominal cramping or bloating due to the air that was used to inflate your colon during the exam.  It is up to you to get the results of your procedure. Ask your health care provider, or the department performing the procedure, when your results will be ready. Summary  A colonoscopy is an exam to look at the entire large intestine.  During a colonoscopy, a lubricated, flexible tube with a camera on the end of it is inserted into the anus and then passed into the colon and other parts of the large intestine.  Follow instructions from your health care provider about eating and drinking before the procedure.  If you were prescribed an oral bowel prep to clean out your colon, take it as told by your health care provider.  After your procedure, your blood pressure, heart rate, breathing rate, and blood oxygen level will be monitored until the medicines you were given have worn off. This information is not intended to replace advice given to you by your health care provider. Make sure you discuss any questions you have with your health care provider. Document Released: 12/06/2000 Document Revised: 10/01/2017 Document Reviewed: 02/20/2016 Elsevier Patient Education  2020 [employer] Inc.     Why follow it? Research shows. . Those who follow the Mediterranean diet have a reduced risk of heart disease  . The diet is associated with a reduced incidence of Parkinson's and Alzheimer's diseases . People following the diet may have longer life expectancies and lower rates of chronic diseases  . The Dietary Guidelines for Americans recommends the Mediterranean diet as an eating plan to promote health and prevent disease  What Is the Mediterranean Diet?  . Healthy eating plan based on typical foods and recipes of Mediterranean-style cooking . The diet is primarily a plant based diet; these foods should make up a majority of meals   Starches - Plant based foods should make up a majority of meals - They  are an important sources of vitamins, minerals,   energy, antioxidants, and fiber - Choose whole grains, foods high in fiber and minimally processed items  - Typical grain sources include wheat, oats, barley, corn, brown rice, bulgar, farro, millet, polenta, couscous  - Various types of beans include chickpeas, lentils, fava beans, black beans, white beans   Fruits  Veggies - Large quantities of antioxidant rich fruits & veggies; 6 or more servings  - Vegetables can be eaten raw or lightly drizzled with oil and cooked  - Vegetables common to the traditional Mediterranean Diet include: artichokes, arugula, beets, broccoli, brussel sprouts, cabbage, carrots, celery, collard greens, cucumbers, eggplant, kale, leeks, lemons, lettuce, mushrooms, okra, onions, peas, peppers, potatoes, pumpkin, radishes, rutabaga, shallots, spinach, sweet potatoes, turnips, zucchini - Fruits common to the Mediterranean Diet include: apples, apricots, avocados, cherries, clementines, dates, figs, grapefruits, grapes, melons, nectarines, oranges, peaches, pears, pomegranates, strawberries, tangerines  Fats - Replace butter and margarine with healthy oils, such as olive oil, canola oil, and tahini  - Limit nuts to no more than a handful a day  - Nuts include walnuts, almonds, pecans, pistachios, pine nuts  - Limit or avoid candied, honey roasted or heavily salted nuts - Olives are central to the Mediterranean diet - can be eaten whole or used in a variety of dishes   Meats Protein - Limiting red meat: no more than a few times a month - When eating red meat: choose lean cuts and keep the portion to the size of deck of cards - Eggs: approx. 0 to 4 times a week  - Fish and lean poultry: at least 2 a week  - Healthy protein sources include, chicken, turkey, lean beef, lamb - Increase intake of seafood such as tuna, salmon, trout, mackerel, shrimp, scallops - Avoid or limit high fat processed meats such as sausage and bacon   Dairy - Include moderate amounts of low fat dairy products  - Focus on healthy dairy such as fat free yogurt, skim milk, low or reduced fat cheese - Limit dairy products higher in fat such as whole or 2% milk, cheese, ice cream  Alcohol - Moderate amounts of red wine is ok  - No more than 5 oz daily for women (all ages) and men older than age 65  - No more than 10 oz of wine daily for men younger than 65  Other - Limit sweets and other desserts  - Use herbs and spices instead of salt to flavor foods  - Herbs and spices common to the traditional Mediterranean Diet include: basil, bay leaves, chives, cloves, cumin, fennel, garlic, lavender, marjoram, mint, oregano, parsley, pepper, rosemary, sage, savory, sumac, tarragon, thyme   It's not just a diet, it's a lifestyle:  . The Mediterranean diet includes lifestyle factors typical of those in the region  . Foods, drinks and meals are best eaten with others and savored . Daily physical activity is important for overall good health . This could be strenuous exercise like running and aerobics . This could also be more leisurely activities such as walking, housework, yard-work, or taking the stairs . Moderation is the key; a balanced and healthy diet accommodates most foods and drinks . Consider portion sizes and frequency of consumption of certain foods   Meal Ideas & Options:  . Breakfast:  o Whole wheat toast or whole wheat English muffins with peanut butter & hard boiled egg o Steel cut oats topped with apples & cinnamon and skim milk  o Fresh fruit: banana, strawberries, melon, berries, peaches    o Smoothies: strawberries, bananas, greek yogurt, peanut butter o Low fat greek yogurt with blueberries and granola  o Egg white omelet with spinach and mushrooms o Breakfast couscous: whole wheat couscous, apricots, skim milk, cranberries  . Sandwiches:  o Hummus and grilled vegetables (peppers, zucchini, squash) on whole wheat bread   o Grilled  chicken on whole wheat pita with lettuce, tomatoes, cucumbers or tzatziki  o Tuna salad on whole wheat bread: tuna salad made with greek yogurt, olives, red peppers, capers, green onions o Garlic rosemary lamb pita: lamb sauted with garlic, rosemary, salt & pepper; add lettuce, cucumber, greek yogurt to pita - flavor with lemon juice and black pepper  . Seafood:  o Mediterranean grilled salmon, seasoned with garlic, basil, parsley, lemon juice and black pepper o Shrimp, lemon, and spinach whole-grain pasta salad made with low fat greek yogurt  o Seared scallops with lemon orzo  o Seared tuna steaks seasoned salt, pepper, coriander topped with tomato mixture of olives, tomatoes, olive oil, minced garlic, parsley, green onions and cappers  . Meats:  o Herbed greek chicken salad with kalamata olives, cucumber, feta  o Red bell peppers stuffed with spinach, bulgur, lean ground beef (or lentils) & topped with feta   o Kebabs: skewers of chicken, tomatoes, onions, zucchini, squash  o Turkey burgers: made with red onions, mint, dill, lemon juice, feta cheese topped with roasted red peppers . Vegetarian o Cucumber salad: cucumbers, artichoke hearts, celery, red onion, feta cheese, tossed in olive oil & lemon juice  o Hummus and whole grain pita points with a greek salad (lettuce, tomato, feta, olives, cucumbers, red onion) o Lentil soup with celery, carrots made with vegetable broth, garlic, salt and pepper  o Tabouli salad: parsley, bulgur, mint, scallions, cucumbers, tomato, radishes, lemon juice, olive oil, salt and pepper.       Fat and Cholesterol Restricted Eating Plan Eating a diet that limits fat and cholesterol may help lower your risk for heart disease and other conditions. Your body needs fat and cholesterol for basic functions, but eating too much of these things can be harmful to your health. Your health care provider may order lab tests to check your blood fat (lipid) and cholesterol  levels. This helps your health care provider understand your risk for certain conditions and whether you need to make diet changes. Work with your health care provider or dietitian to make an eating plan that is right for you. Your plan includes:  Limit your fat intake to ______% or less of your total calories a day.  Limit your saturated fat intake to ______% or less of your total calories a day.  Limit the amount of cholesterol in your diet to less than _________mg a day.  Eat ___________ g of fiber a day. What are tips for following this plan? General guidelines   If you are overweight, work with your health care provider to lose weight safely. Losing just 5-10% of your body weight can improve your overall health and help prevent diseases such as diabetes and heart disease.  Avoid: ? Foods with added sugar. ? Fried foods. ? Foods that contain partially hydrogenated oils, including stick margarine, some tub margarines, cookies, crackers, and other baked goods.  Limit alcohol intake to no more than 1 drink a day for nonpregnant women and 2 drinks a day for men. One drink equals 12 oz of beer, 5 oz of wine, or 1 oz of hard liquor. Reading food labels  Check food   labels for: ? Trans fats, partially hydrogenated oils, or high amounts of saturated fat. Avoid foods that contain saturated fat and trans fat. ? The amount of cholesterol in each serving. Try to eat no more than 200 mg of cholesterol each day. ? The amount of fiber in each serving. Try to eat at least 20-30 g of fiber each day.  Choose foods with healthy fats, such as: ? Monounsaturated and polyunsaturated fats. These include olive and canola oil, flaxseeds, walnuts, almonds, and seeds. ? Omega-3 fats. These are found in foods such as salmon, mackerel, sardines, tuna, flaxseed oil, and ground flaxseeds.  Choose grain products that have whole grains. Look for the word "whole" as the first word in the ingredient  list. Cooking  Cook foods using methods other than frying. Baking, boiling, grilling, and broiling are some healthy options.  Eat more home-cooked food and less restaurant, buffet, and fast food.  Avoid cooking using saturated fats. ? Animal sources of saturated fats include meats, butter, and cream. ? Plant sources of saturated fats include palm oil, palm kernel oil, and coconut oil. Meal planning   At meals, imagine dividing your plate into fourths: ? Fill one-half of your plate with vegetables and green salads. ? Fill one-fourth of your plate with whole grains. ? Fill one-fourth of your plate with lean protein foods.  Eat fish that is high in omega-3 fats at least two times a week.  Eat more foods that contain fiber, such as whole grains, beans, apples, broccoli, carrots, peas, and barley. These foods help promote healthy cholesterol levels in the blood. Recommended foods Grains  Whole grains, such as whole wheat or whole grain breads, crackers, cereals, and pasta. Unsweetened oatmeal, bulgur, barley, quinoa, or brown rice. Corn or whole wheat flour tortillas. Vegetables  Fresh or frozen vegetables (raw, steamed, roasted, or grilled). Green salads. Fruits  All fresh, canned (in natural juice), or frozen fruits. Meats and other protein foods  Ground beef (85% or leaner), grass-fed beef, or beef trimmed of fat. Skinless chicken or turkey. Ground chicken or turkey. Pork trimmed of fat. All fish and seafood. Egg whites. Dried beans, peas, or lentils. Unsalted nuts or seeds. Unsalted canned beans. Natural nut butters without added sugar and oil. Dairy  Low-fat or nonfat dairy products, such as skim or 1% milk, 2% or reduced-fat cheeses, low-fat and fat-free ricotta or cottage cheese, or plain low-fat and nonfat yogurt. Fats and oils  Tub margarine without trans fats. Light or reduced-fat mayonnaise and salad dressings. Avocado. Olive, canola, sesame, or safflower oils. The items  listed above may not be a complete list of recommended foods or beverages. Contact your dietitian for more options. Foods to avoid Grains  White bread. White pasta. White rice. Cornbread. Bagels, pastries, and croissants. Crackers and snack foods that contain trans fat and hydrogenated oils. Vegetables  Vegetables cooked in cheese, cream, or butter sauce. Fried vegetables. Fruits  Canned fruit in heavy syrup. Fruit in cream or butter sauce. Fried fruit. Meats and other protein foods  Fatty cuts of meat. Ribs, chicken wings, bacon, sausage, bologna, salami, chitterlings, fatback, hot dogs, bratwurst, and packaged lunch meats. Liver and organ meats. Whole eggs and egg yolks. Chicken and turkey with skin. Fried meat. Dairy  Whole or 2% milk, cream, half-and-half, and cream cheese. Whole milk cheeses. Whole-fat or sweetened yogurt. Full-fat cheeses. Nondairy creamers and whipped toppings. Processed cheese, cheese spreads, and cheese curds. Beverages  Alcohol. Sugar-sweetened drinks such as sodas, lemonade, and fruit   drinks. Fats and oils  Butter, stick margarine, lard, shortening, ghee, or bacon fat. Coconut, palm kernel, and palm oils. Sweets and desserts  Corn syrup, sugars, honey, and molasses. Candy. Jam and jelly. Syrup. Sweetened cereals. Cookies, pies, cakes, donuts, muffins, and ice cream. The items listed above may not be a complete list of foods and beverages to avoid. Contact your dietitian for more information. Summary  Your body needs fat and cholesterol for basic functions. However, eating too much of these things can be harmful to your health.  Work with your health care provider and dietitian to follow a diet low in fat and cholesterol. Doing this may help lower your risk for heart disease and other conditions.  Choose healthy fats, such as monounsaturated and polyunsaturated fats, and foods high in omega-3 fatty acids.  Eat fiber-rich foods, such as whole grains,  beans, peas, fruits, and vegetables.  Limit or avoid alcohol, fried foods, and foods high in saturated fats, partially hydrogenated oils, and sugar. This information is not intended to replace advice given to you by your health care provider. Make sure you discuss any questions you have with your health care provider. Document Released: 12/09/2005 Document Revised: 11/21/2017 Document Reviewed: 08/26/2017 Elsevier Patient Education  2020 [employer]  American Heart Association (AHA) Exercise Recommendation  Being physically active is important to prevent heart disease and stroke, the nation's No. 1and No. 5killers. To improve overall cardiovascular health, we suggest at least 150 minutes per week of moderate exercise or 75 minutes per week of vigorous exercise (or a combination of moderate and vigorous activity). Thirty minutes a day, five times a week is an easy goal to remember. You will also experience benefits even if you divide your time into two or three segments of 10 to 15 minutes per day.  For people who would benefit from lowering their blood pressure or cholesterol, we recommend 40 minutes of aerobic exercise of moderate to vigorous intensity three to four times a week to lower the risk for heart attack and stroke.  Physical activity is anything that makes you move your body and burn calories.  This includes things like climbing stairs or playing sports. Aerobic exercises benefit your heart, and include walking, jogging, swimming or biking. Strength and stretching exercises are best for overall stamina and flexibility.  The simplest, positive change you can make to effectively improve your heart health is to start walking. It's enjoyable, free, easy, social and great exercise. A walking program is flexible and boasts high success rates because people can stick with it. It's easy for walking to become a regular and satisfying part of life.   For Overall Cardiovascular Health:  At  least 30 minutes of moderate-intensity aerobic activity at least 5 days per week for a total of 150  OR   At least 25 minutes of vigorous aerobic activity at least 3 days per week for a total of 75 minutes; or a combination of moderate- and vigorous-intensity aerobic activity  AND   Moderate- to high-intensity muscle-strengthening activity at least 2 days per week for additional health benefits.  For Lowering Blood Pressure and Cholesterol  An average 40 minutes of moderate- to vigorous-intensity aerobic activity 3 or 4 times per week  What if I can't make it to the time goal? Something is always better than nothing! And everyone has to start somewhere. Even if you've been sedentary for years, today is the day you can begin to make healthy changes in your life. If   you don't think you'll make it for 30 or 40 minutes, set a reachable goal for today. You can work up toward your overall goal by increasing your time as you get stronger. Don't let all-or-nothing thinking rob you of doing what you can every day.  Source:http://www.heart.org    

## 2019-10-06 NOTE — Progress Notes (Signed)
Established Patient Office Visit  Subjective:  Patient ID: Glenn Lewis, male    DOB: 1951-03-21  Age: 68 y.o. MRN: [id number]  CC:  Chief Complaint  Patient presents with  . Annual Exam    HPI Glenn Lewis presents for CPE and labs  Only concern today is frequent urination. Has happened in his father and brother. Concerned for kidney stones - we discussed this is more likely prostate related. Will draw PSA after shared decision making. He understands risks/benefits to PSA.   Otherwise feels good. 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 Exercises daily. Seldom drinks alcohol and does not smoke.   History reviewed. No pertinent past medical history.  History reviewed. No pertinent surgical history.  Family History  Problem Relation Age of Onset  . Hypertension Mother     Social History   Socioeconomic History  . Marital status: Divorced    Spouse name: Not on file  . Number of children: Not on file  . Years of education: Not on file  . Highest education level: Not on file  Occupational History  . Not on file  Social Needs  . Financial resource strain: Not on file  . Food insecurity    Worry: Not on file    Inability: Not on file  . Transportation needs    Medical: Not on file    Non-medical: Not on file  Tobacco Use  . Smoking status: Former Research scientist (life sciences)  . Smokeless tobacco: Never Used  Substance and Sexual Activity  . Alcohol use: Yes    Comment: sometimes  . Drug use: No  . Sexual activity: Not on file  Lifestyle  . Physical activity    Days per week: Not on file    Minutes per session: Not on file  . Stress: Not on file  Relationships  . Social Herbalist on phone: Not on file    Gets together: Not on file    Attends religious service: Not on file    Active member of club or organization: Not on file    Attends meetings of clubs or organizations: Not on file    Relationship status: Not on file  . Intimate partner violence    Fear of  current or ex partner: Not on file    Emotionally abused: Not on file    Physically abused: Not on file    Forced sexual activity: Not on file  Other Topics Concern  . Not on file  Social History Narrative  . Not on file    Outpatient Medications Prior to Visit  Medication Sig Dispense Refill  . oxybutynin (DITROPAN-XL) 10 MG 24 hr tablet Take 10 mg by mouth at bedtime.    . rosuvastatin (CRESTOR) 5 MG tablet Take 1 tablet (5 mg total) by mouth daily. 90 tablet 3  . tamsulosin (FLOMAX) 0.4 MG CAPS capsule Take 0.4 mg by mouth.     No facility-administered medications prior to visit.     No Known Allergies  ROS Review of Systems  Constitutional: Negative.   HENT: Negative.   Eyes: Negative.   Respiratory: Negative.   Cardiovascular: Negative.   Gastrointestinal: Negative.   Endocrine: Negative.   Genitourinary: Positive for frequency. Negative for decreased urine volume, difficulty urinating, discharge, penile pain, penile swelling and urgency.  Musculoskeletal: Negative.   Skin: Negative.   Allergic/Immunologic: Negative.   Neurological: Negative.   Hematological: Negative.   Psychiatric/Behavioral: Negative.   All other  systems reviewed and are negative.     Objective:    Physical Exam  Constitutional: He is oriented to person, place, and time. He appears well-developed and well-nourished. No distress.  HENT:  Head: Normocephalic and atraumatic.  Right Ear: External ear normal.  Left Ear: External ear normal.  Nose: Nose normal.  Mouth/Throat: Oropharynx is clear and moist. No oropharyngeal exudate.  Eyes: Pupils are equal, round, and reactive to light. Conjunctivae and EOM are normal. Right eye exhibits no discharge. Left eye exhibits no discharge. No scleral icterus.  Neck: Normal range of motion. Neck supple. No tracheal deviation present. No thyromegaly present.  Cardiovascular: Normal rate, regular rhythm, normal heart sounds and intact distal pulses. Exam  reveals no gallop and no friction rub.  No murmur heard. Pulmonary/Chest: Effort normal and breath sounds normal. No respiratory distress. He has no wheezes. He has no rales. He exhibits no tenderness.  Abdominal: Soft. Bowel sounds are normal. He exhibits no distension and no mass. There is no abdominal tenderness. There is no rebound and no guarding.  Genitourinary:    Genitourinary Comments: deferred   Musculoskeletal: Normal range of motion.        General: No tenderness, deformity or edema.  Lymphadenopathy:    He has no cervical adenopathy.  Neurological: He is alert and oriented to person, place, and time. He has normal reflexes. No cranial nerve deficit. He exhibits normal muscle tone. Coordination normal.  Skin: Skin is warm and dry. No rash noted. He is not diaphoretic. No erythema. No pallor.  Psychiatric: He has a normal mood and affect. His behavior is normal. Judgment and thought content normal.  Nursing note and vitals reviewed.   BP 129/77   Pulse 77   Temp 98.3 F (36.8 C) (Oral)   Resp 16   Ht 5' 5.16" (1.655 m)   Wt 159 lb (72.1 kg)   SpO2 98%   BMI 26.33 kg/m  Wt Readings from Last 3 Encounters:  10/06/19 159 lb (72.1 kg)  07/06/18 167 lb 12.8 oz (76.1 kg)  01/01/18 168 lb (76.2 kg)     Health Maintenance Due  Topic Date Due  . Hepatitis C Screening  [birth year]  . TETANUS/TDAP  09/10/1969  . PNA vac Low Risk Adult (1 of 2 - PCV13) 09/11/2015  . INFLUENZA VACCINE  07/24/2019    There are no preventive care reminders to display for this patient.  Lab Results  Component Value Date   TSH 4.170 07/06/2018   Lab Results  Component Value Date   WBC 6.2 07/06/2018   HGB 12.3 (A) 07/06/2018   HCT 40.8 (A) 07/06/2018   MCV 93.4 07/06/2018   PLT 243 09/12/2017   Lab Results  Component Value Date   NA 138 09/12/2017   K 5.0 09/12/2017   CO2 24 09/12/2017   GLUCOSE 114 (H) 09/12/2017   BUN 13 09/12/2017   CREATININE 0.82 09/12/2017   BILITOT <0.2  09/12/2017   ALKPHOS 46 09/12/2017   AST 14 09/12/2017   ALT 12 09/12/2017   PROT 8.4 09/12/2017   ALBUMIN 3.8 09/12/2017   CALCIUM 9.3 09/12/2017   Lab Results  Component Value Date   CHOL 248 (H) 07/07/2018   Lab Results  Component Value Date   HDL 49 07/07/2018   Lab Results  Component Value Date   LDLCALC 176 (H) 07/07/2018   Lab Results  Component Value Date   TRIG 117 07/07/2018   Lab Results  Component Value Date  CHOLHDL 5.1 (H) 07/07/2018   Lab Results  Component Value Date   HGBA1C 6.0 (H) 01/01/2018      Assessment & Plan:   Problem List Items Addressed This Visit    None    Visit Diagnoses    Frequent urination    -  Primary   Relevant Orders   POCT urinalysis dipstick (Completed)   PSA   Screening for endocrine, metabolic and immunity disorder       Relevant Orders   CBC with Differential/Platelet   Comprehensive metabolic panel   TSH   Lipid screening       Relevant Orders   Lipid panel   Fatigue, unspecified type        Relevant Orders   CBC with Differential/Platelet   Routine general medical examination at a health care facility          No orders of the defined types were placed in this encounter.   Follow-up: Return in 1 year (on 10/05/2020).   PLAN  Mr. KOLTAN YANAGI is an extremely pleasant patient who is in good health for his age. I have no concerns regarding his health at this time.  Labs drawn, will follow up with patient as warranted.  Patient encouraged to call clinic with any questions, comments, or concerns.   Maximiano Coss, NP

## 2019-10-07 LAB — COMPREHENSIVE METABOLIC PANEL
ALT: 19 IU/L (ref 0–44)
AST: 18 IU/L (ref 0–40)
Albumin/Globulin Ratio: 0.8 — ABNORMAL LOW (ref 1.2–2.2)
Albumin: 3.9 g/dL (ref 3.8–4.8)
Alkaline Phosphatase: 39 IU/L (ref 39–117)
BUN/Creatinine Ratio: 14 (ref 10–24)
BUN: 13 mg/dL (ref 8–27)
Bilirubin Total: 0.3 mg/dL (ref 0.0–1.2)
CO2: 24 mmol/L (ref 20–29)
Calcium: 9.1 mg/dL (ref 8.6–10.2)
Chloride: 100 mmol/L (ref 96–106)
Creatinine, Ser: 0.96 mg/dL (ref 0.76–1.27)
GFR calc Af Amer: 93 mL/min/{1.73_m2} (ref >59)
GFR calc non Af Amer: 80 mL/min/{1.73_m2} (ref >59)
Globulin, Total: 4.7 g/dL — ABNORMAL HIGH (ref 1.5–4.5)
Glucose: 92 mg/dL (ref 65–99)
Potassium: 4.5 mmol/L (ref 3.5–5.2)
Sodium: 136 mmol/L (ref 134–144)
Total Protein: 8.6 g/dL — ABNORMAL HIGH (ref 6.0–8.5)

## 2019-10-07 LAB — CBC WITH DIFFERENTIAL/PLATELET
Basophils Absolute: 0 10*3/uL (ref 0.0–0.2)
Basos: 1 %
EOS (ABSOLUTE): 0.2 10*3/uL (ref 0.0–0.4)
Eos: 3 %
Hematocrit: 39.9 % (ref 37.5–51.0)
Hemoglobin: 13.1 g/dL (ref 13.0–17.7)
Immature Grans (Abs): 0 10*3/uL (ref 0.0–0.1)
Immature Granulocytes: 0 %
Lymphocytes Absolute: 2.1 10*3/uL (ref 0.7–3.1)
Lymphs: 36 %
MCH: 31.1 pg (ref 26.6–33.0)
MCHC: 32.8 g/dL (ref 31.5–35.7)
MCV: 95 fL (ref 79–97)
Monocytes Absolute: 0.6 10*3/uL (ref 0.1–0.9)
Monocytes: 10 %
Neutrophils Absolute: 2.9 10*3/uL (ref 1.4–7.0)
Neutrophils: 50 %
Platelets: 223 10*3/uL (ref 150–450)
RBC: 4.21 x10E6/uL (ref 4.14–5.80)
RDW: 13.1 % (ref 11.6–15.4)
WBC: 5.9 10*3/uL (ref 3.4–10.8)

## 2019-10-07 LAB — TSH: TSH: 3.99 u[IU]/mL (ref 0.450–4.500)

## 2019-10-07 LAB — PSA: Prostate Specific Ag, Serum: 3.2 ng/mL (ref 0.0–4.0)

## 2019-10-07 LAB — LIPID PANEL
Chol/HDL Ratio: 3.9 ratio (ref 0.0–5.0)
Cholesterol, Total: 249 mg/dL — ABNORMAL HIGH (ref 100–199)
HDL: 64 mg/dL (ref >39)
LDL Chol Calc (NIH): 167 mg/dL — ABNORMAL HIGH (ref 0–99)
Triglycerides: 104 mg/dL (ref 0–149)
VLDL Cholesterol Cal: 18 mg/dL (ref 5–40)

## 2019-10-08 ENCOUNTER — Encounter: Payer: Self-pay | Admitting: Registered Nurse

## 2019-10-08 DIAGNOSIS — E782 Mixed hyperlipidemia: Secondary | ICD-10-CM

## 2019-10-08 DIAGNOSIS — R351 Nocturia: Secondary | ICD-10-CM

## 2019-10-08 MED ORDER — ATORVASTATIN CALCIUM 20 MG PO TABS: 20.0000 mg | ORAL_TABLET | Freq: Every day | ORAL | 3 refills | Status: DC

## 2019-10-19 NOTE — Addendum Note (Signed)
Addended by: Maximiano Coss on: 10/19/2019 05:41 PM   Modules accepted: Level of Service

## 2020-02-04 ENCOUNTER — Other Ambulatory Visit: Payer: Self-pay

## 2020-02-04 ENCOUNTER — Emergency Department (HOSPITAL_COMMUNITY)
Admission: EM | Admit: 2020-02-04 | Discharge: 2020-02-05 | Disposition: A | Discharge status: Home / Self Care | Payer: Medicare Other | Attending: Emergency Medicine | Admitting: Emergency Medicine

## 2020-02-04 ENCOUNTER — Emergency Department (HOSPITAL_COMMUNITY): Payer: Medicare Other

## 2020-02-04 ENCOUNTER — Encounter (HOSPITAL_COMMUNITY): Payer: Self-pay

## 2020-02-04 DIAGNOSIS — S060X9A Concussion with loss of consciousness of unspecified duration, initial encounter: Secondary | ICD-10-CM | POA: Diagnosis not present

## 2020-02-04 DIAGNOSIS — Y999 Unspecified external cause status: Secondary | ICD-10-CM | POA: Diagnosis not present

## 2020-02-04 DIAGNOSIS — Y92410 Unspecified street and highway as the place of occurrence of the external cause: Secondary | ICD-10-CM | POA: Insufficient documentation

## 2020-02-04 DIAGNOSIS — Z23 Encounter for immunization: Secondary | ICD-10-CM | POA: Diagnosis not present

## 2020-02-04 DIAGNOSIS — S161XXA Strain of muscle, fascia and tendon at neck level, initial encounter: Secondary | ICD-10-CM | POA: Diagnosis not present

## 2020-02-04 DIAGNOSIS — S022XXB Fracture of nasal bones, initial encounter for open fracture: Secondary | ICD-10-CM | POA: Diagnosis not present

## 2020-02-04 DIAGNOSIS — R1030 Lower abdominal pain, unspecified: Secondary | ICD-10-CM | POA: Diagnosis not present

## 2020-02-04 DIAGNOSIS — S0990XA Unspecified injury of head, initial encounter: Secondary | ICD-10-CM | POA: Diagnosis present

## 2020-02-04 DIAGNOSIS — S2231XA Fracture of one rib, right side, initial encounter for closed fracture: Secondary | ICD-10-CM | POA: Insufficient documentation

## 2020-02-04 DIAGNOSIS — Y939 Activity, unspecified: Secondary | ICD-10-CM | POA: Insufficient documentation

## 2020-02-04 DIAGNOSIS — I10 Essential (primary) hypertension: Secondary | ICD-10-CM | POA: Diagnosis not present

## 2020-02-04 DIAGNOSIS — D3500 Benign neoplasm of unspecified adrenal gland: Secondary | ICD-10-CM | POA: Insufficient documentation

## 2020-02-04 DIAGNOSIS — Z79899 Other long term (current) drug therapy: Secondary | ICD-10-CM | POA: Diagnosis not present

## 2020-02-04 DIAGNOSIS — E039 Hypothyroidism, unspecified: Secondary | ICD-10-CM | POA: Diagnosis not present

## 2020-02-04 DIAGNOSIS — Z87891 Personal history of nicotine dependence: Secondary | ICD-10-CM | POA: Diagnosis not present

## 2020-02-04 DIAGNOSIS — S32010A Wedge compression fracture of first lumbar vertebra, initial encounter for closed fracture: Secondary | ICD-10-CM | POA: Insufficient documentation

## 2020-02-04 DIAGNOSIS — M549 Dorsalgia, unspecified: Secondary | ICD-10-CM

## 2020-02-04 HISTORY — DX: Benign prostatic hyperplasia without lower urinary tract symptoms: N40.0

## 2020-02-04 HISTORY — DX: Hypothyroidism, unspecified: E03.9

## 2020-02-04 HISTORY — DX: Essential (primary) hypertension: I10

## 2020-02-04 MED ORDER — TETANUS-DIPHTH-ACELL PERTUSSIS 5-2.5-18.5 LF-MCG/0.5 IM SUSP
0.5000 mL | Freq: Once | INTRAMUSCULAR | Status: AC
  Administered 2020-02-04: 0.5 mL via INTRAMUSCULAR
  Filled 2020-02-04: qty 0.5

## 2020-02-04 MED ORDER — FENTANYL CITRATE (PF) 100 MCG/2ML IJ SOLN
50.0000 ug | Freq: Once | INTRAMUSCULAR | Status: AC
  Administered 2020-02-04: 50 ug via INTRAVENOUS
  Filled 2020-02-04: qty 2
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
## 2020-02-04 NOTE — ED Notes (Signed)
Pt has 2+ pedal pulses bilat, cap refill less than sec, warm to touch, sensation intact. Able to move all extremities.

## 2020-02-05 ENCOUNTER — Encounter (HOSPITAL_COMMUNITY): Payer: Self-pay | Admitting: Radiology

## 2020-02-05 ENCOUNTER — Emergency Department (HOSPITAL_COMMUNITY): Payer: Medicare Other

## 2020-02-05 LAB — COMPREHENSIVE METABOLIC PANEL
ALT: 23 U/L (ref 0–44)
AST: 22 U/L (ref 15–41)
Albumin: 3.3 g/dL — ABNORMAL LOW (ref 3.5–5.0)
Alkaline Phosphatase: 32 U/L — ABNORMAL LOW (ref 38–126)
Anion gap: 8 (ref 5–15)
BUN: 26 mg/dL — ABNORMAL HIGH (ref 8–23)
CO2: 26 mmol/L (ref 22–32)
Calcium: 9 mg/dL (ref 8.9–10.3)
Chloride: 102 mmol/L (ref 98–111)
Creatinine, Ser: 1.33 mg/dL — ABNORMAL HIGH (ref 0.61–1.24)
GFR calc Af Amer: >60 mL/min (ref >60)
GFR calc non Af Amer: 54 mL/min — ABNORMAL LOW (ref >60)
Glucose, Bld: 122 mg/dL — ABNORMAL HIGH (ref 70–99)
Potassium: 4.1 mmol/L (ref 3.5–5.1)
Sodium: 136 mmol/L (ref 135–145)
Total Bilirubin: 0.4 mg/dL (ref 0.3–1.2)
Total Protein: 7.9 g/dL (ref 6.5–8.1)

## 2020-02-05 LAB — URINALYSIS, ROUTINE W REFLEX MICROSCOPIC
Bilirubin Urine: NEGATIVE
Glucose, UA: NEGATIVE mg/dL
Hgb urine dipstick: NEGATIVE
Ketones, ur: NEGATIVE mg/dL
Leukocytes,Ua: NEGATIVE
Nitrite: NEGATIVE
Protein, ur: NEGATIVE mg/dL
Specific Gravity, Urine: 1.019 (ref 1.005–1.030)
pH: 7 (ref 5.0–8.0)

## 2020-02-05 LAB — PROTIME-INR
INR: 1 (ref 0.8–1.2)
Prothrombin Time: 13.2 seconds (ref 11.4–15.2)

## 2020-02-05 LAB — I-STAT CHEM 8, ED
BUN: 29 mg/dL — ABNORMAL HIGH (ref 8–23)
Calcium, Ion: 1.2 mmol/L (ref 1.15–1.40)
Chloride: 103 mmol/L (ref 98–111)
Creatinine, Ser: 1.3 mg/dL — ABNORMAL HIGH (ref 0.61–1.24)
Glucose, Bld: 115 mg/dL — ABNORMAL HIGH (ref 70–99)
HCT: 42 % (ref 39.0–52.0)
Hemoglobin: 14.3 g/dL (ref 13.0–17.0)
Potassium: 4.3 mmol/L (ref 3.5–5.1)
Sodium: 140 mmol/L (ref 135–145)
TCO2: 31 mmol/L (ref 22–32)

## 2020-02-05 LAB — RAPID URINE DRUG SCREEN, HOSP PERFORMED
Amphetamines: NOT DETECTED
Barbiturates: NOT DETECTED
Benzodiazepines: NOT DETECTED
Cocaine: NOT DETECTED
Opiates: NOT DETECTED
Tetrahydrocannabinol: NOT DETECTED

## 2020-02-05 LAB — CBC
HCT: 37.9 % — ABNORMAL LOW (ref 39.0–52.0)
Hemoglobin: 12.2 g/dL — ABNORMAL LOW (ref 13.0–17.0)
MCH: 31.9 pg (ref 26.0–34.0)
MCHC: 32.2 g/dL (ref 30.0–36.0)
MCV: 99.2 fL (ref 80.0–100.0)
Platelets: 187 10*3/uL (ref 150–400)
RBC: 3.82 MIL/uL — ABNORMAL LOW (ref 4.22–5.81)
RDW: 12.7 % (ref 11.5–15.5)
WBC: 14.5 10*3/uL — ABNORMAL HIGH (ref 4.0–10.5)
nRBC: 0 % (ref 0.0–0.2)

## 2020-02-05 LAB — I-STAT BETA HCG BLOOD, ED (MC, WL, AP ONLY): I-stat hCG, quantitative: <5 m[IU]/mL (ref <5)

## 2020-02-05 LAB — ETHANOL: Alcohol, Ethyl (B): <10 mg/dL (ref <10)

## 2020-02-05 LAB — LACTIC ACID, PLASMA: Lactic Acid, Venous: 1 mmol/L (ref 0.5–1.9)

## 2020-02-05 LAB — CDS SEROLOGY

## 2020-02-05 MED ORDER — IOHEXOL 300 MG/ML  SOLN
100.0000 mL | Freq: Once | INTRAMUSCULAR | Status: AC | PRN
  Administered 2020-02-05: 01:00:00 100 mL via INTRAVENOUS

## 2020-02-05 MED ORDER — HYDROCODONE-ACETAMINOPHEN 5-325 MG PO TABS: 1.0000 | ORAL_TABLET | Freq: Four times a day (QID) | ORAL | 0 refills | Status: DC | PRN

## 2020-02-05 MED ORDER — CEPHALEXIN 500 MG PO CAPS: 500.0000 mg | ORAL_CAPSULE | Freq: Two times a day (BID) | ORAL | 0 refills | Status: DC

## 2020-02-05 MED ORDER — HYDROCODONE-ACETAMINOPHEN 5-325 MG PO TABS
2.0000 | ORAL_TABLET | Freq: Once | ORAL | Status: AC
  Administered 2020-02-05: 06:00:00 2 via ORAL
  Filled 2020-02-05: qty 2

## 2020-02-05 MED ORDER — CEFAZOLIN SODIUM-DEXTROSE 2-4 GM/100ML-% IV SOLN
2.0000 g | Freq: Once | INTRAVENOUS | Status: AC
  Administered 2020-02-05: 04:00:00 2 g via INTRAVENOUS
  Filled 2020-02-05: qty 100

## 2020-02-05 MED ORDER — FENTANYL CITRATE (PF) 100 MCG/2ML IJ SOLN
100.0000 ug | Freq: Once | INTRAMUSCULAR | Status: AC
  Administered 2020-02-05: 04:00:00 100 ug via INTRAVENOUS
  Filled 2020-02-05: qty 2

## 2020-02-05 NOTE — ED Notes (Signed)
ED Provider at bedside. 

## 2020-02-05 NOTE — ED Notes (Signed)
Pt.ambulated well out the room into the hall and back to bed without being dizziness
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
## 2020-02-05 NOTE — ED Notes (Signed)
Patient verbalized understanding of dc instructions, vss, ambulatory with nad. Son at bedside to take patient home upon discharge.

## 2020-02-05 NOTE — ED Notes (Signed)
Patient going to CT at this time

## 2020-02-05 NOTE — ED Notes (Signed)
Called Dr Glenford Peers to Dr Launa Flight

## 2020-02-07 ENCOUNTER — Telehealth: Payer: Self-pay | Admitting: Registered Nurse

## 2020-02-07 NOTE — Telephone Encounter (Signed)
Called to schedule, no answer  Left voicemail to call back and schedule

## 2020-02-07 NOTE — Telephone Encounter (Signed)
-----   Message from Maximiano Coss, NP sent at 02/07/2020  9:13 AM EST ----- Regarding: FW: f/u imaging Good morning,  If we could get Mr. Fulcher an appointment to follow up on his recent ER admission, that would be great.  Thank you,   Kathrin Ruddy, NP ----- Message ----- From: Ripley Fraise, MD Sent: 02/05/2020   3:21 AM EST To: Maximiano Coss, NP Subject: f/u imaging                                    Could you followup with this patient for his lumbar compression fracture and also adrenal adenoma?  He may need repeat Ct imaging for adenoma.  Thanks

## 2020-02-09 DIAGNOSIS — J342 Deviated nasal septum: Secondary | ICD-10-CM | POA: Insufficient documentation

## 2020-02-09 DIAGNOSIS — S022XXA Fracture of nasal bones, initial encounter for closed fracture: Secondary | ICD-10-CM | POA: Insufficient documentation

## 2020-02-14 ENCOUNTER — Ambulatory Visit (INDEPENDENT_AMBULATORY_CARE_PROVIDER_SITE_OTHER): Payer: Medicare Other | Admitting: Registered Nurse

## 2020-02-14 ENCOUNTER — Other Ambulatory Visit: Payer: Self-pay

## 2020-02-14 ENCOUNTER — Encounter: Payer: Self-pay | Admitting: Registered Nurse

## 2020-02-14 DIAGNOSIS — S022XXA Fracture of nasal bones, initial encounter for closed fracture: Secondary | ICD-10-CM | POA: Diagnosis not present

## 2020-02-14 MED ORDER — METHOCARBAMOL 500 MG PO TABS: 500.0000 mg | ORAL_TABLET | Freq: Four times a day (QID) | ORAL | 0 refills | Status: DC

## 2020-02-14 MED ORDER — TRAMADOL HCL 50 MG PO TABS: 50.0000 mg | ORAL_TABLET | Freq: Three times a day (TID) | ORAL | 0 refills | Status: AC | PRN

## 2020-02-14 NOTE — Patient Instructions (Signed)
° ° ° °  If you have lab work done today you will be contacted with your lab results within the next 2 weeks.  If you have not heard from us then please contact us. The fastest way to get your results is to register for My Chart. ° ° °IF you received an x-ray today, you will receive an invoice from [facility]. Please contact [facility] at [phone] with questions or concerns regarding your invoice.  ° °IF you received labwork today, you will receive an invoice from [facility]. Please contact [facility] at [phone] with questions or concerns regarding your invoice.  ° °Our billing staff will not be able to assist you with questions regarding bills from these companies. ° °You will be contacted with the lab results as soon as they are available. The fastest way to get your results is to activate your My Chart account. Instructions are located on the last page of this paperwork. If you have not heard from us regarding the results in 2 weeks, please contact this office. °  ° ° ° °

## 2020-02-18 ENCOUNTER — Telehealth: Payer: Self-pay | Admitting: Registered Nurse

## 2020-02-18 NOTE — Telephone Encounter (Signed)
Please have provider sign the 02/14/2020 ovc note.  The Neurosurgery referral cannot be processed until the note is signed.

## 2020-02-18 NOTE — Telephone Encounter (Signed)
Please sign off on OV from 02/14/2020

## 2020-02-19 ENCOUNTER — Encounter: Payer: Self-pay | Admitting: Registered Nurse
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
## 2020-07-04 ENCOUNTER — Encounter: Payer: Self-pay | Admitting: Registered Nurse

## 2020-07-04 ENCOUNTER — Ambulatory Visit (INDEPENDENT_AMBULATORY_CARE_PROVIDER_SITE_OTHER): Payer: Medicare Other | Admitting: Registered Nurse

## 2020-07-04 ENCOUNTER — Other Ambulatory Visit: Payer: Self-pay

## 2020-07-04 VITALS — BP 125/78 | HR 71 | Temp 97.7°F | Resp 18 | Ht 65.0 in | Wt 170.0 lb

## 2020-07-04 DIAGNOSIS — Z1329 Encounter for screening for other suspected endocrine disorder: Secondary | ICD-10-CM

## 2020-07-04 NOTE — Patient Instructions (Signed)
° ° ° °  If you have lab work done today you will be contacted with your lab results within the next 2 weeks.  If you have not heard from us then please contact us. The fastest way to get your results is to register for My Chart. ° ° °IF you received an x-ray today, you will receive an invoice from [facility]. Please contact [facility] at [phone] with questions or concerns regarding your invoice.  ° °IF you received labwork today, you will receive an invoice from [facility]. Please contact [facility] at [phone] with questions or concerns regarding your invoice.  ° °Our billing staff will not be able to assist you with questions regarding bills from these companies. ° °You will be contacted with the lab results as soon as they are available. The fastest way to get your results is to activate your My Chart account. Instructions are located on the last page of this paperwork. If you have not heard from us regarding the results in 2 weeks, please contact this office. °  ° ° ° °

## 2020-07-05 ENCOUNTER — Encounter: Payer: Self-pay | Admitting: Registered Nurse

## 2020-07-05 DIAGNOSIS — E039 Hypothyroidism, unspecified: Secondary | ICD-10-CM

## 2020-07-05 LAB — THYROID PANEL WITH TSH
Free Thyroxine Index: 1.4 (ref 1.2–4.9)
T3 Uptake Ratio: 26 % (ref 24–39)
T4, Total: 5.5 ug/dL (ref 4.5–12.0)
TSH: 3.82 u[IU]/mL (ref 0.450–4.500)

## 2020-07-05 MED ORDER — LEVOTHYROXINE SODIUM 25 MCG PO TABS: 25.0000 ug | ORAL_TABLET | Freq: Every day | ORAL | 1 refills | Status: DC

## 2020-09-19 ENCOUNTER — Encounter: Payer: Self-pay | Admitting: Registered Nurse

## 2020-09-19 NOTE — Progress Notes (Signed)
Established Patient Office Visit  Subjective:  Patient ID: Glenn Lewis, male    DOB: 1950/12/29  Age: 69 y.o. MRN: [id number]  CC:  Chief Complaint  Patient presents with  . Thyroid Problem    Patient states he had some thyroid issues and was prescribed medication and then was taken off the medication and started to feel some dizzness , headaches , depression. Per patient he went to [place] last month and was prescribed Levotiroxina and feels much better would still like to check TSH.    HPI Glenn Lewis presents for history of thyroid dysfunction. Had been on levothyroxine - felt stable on this Since coming off of it, he has felt some dizziness, headaches, depression, and fatigue. Would like to get TSH checked and resume levothyroxine if applicable  Past Medical History:  Diagnosis Date  . BPH (benign prostatic hyperplasia)   . Hypertension   . Hypothyroidism     Past Surgical History:  Procedure Laterality Date  . CARPAL TUNNEL RELEASE Bilateral     Family History  Problem Relation Age of Onset  . Hypertension Mother     Social History   Socioeconomic History  . Marital status: Divorced    Spouse name: Not on file  . Number of children: Not on file  . Years of education: Not on file  . Highest education level: Not on file  Occupational History  . Not on file  Tobacco Use  . Smoking status: Former Research scientist (life sciences)  . Smokeless tobacco: Never Used  Vaping Use  . Vaping Use: Never used  Substance and Sexual Activity  . Alcohol use: Yes    Comment: sometimes  . Drug use: No  . Sexual activity: Not on file  Other Topics Concern  . Not on file  Social History Narrative  . Not on file   Social Determinants of Health   Financial Resource Strain:   . Difficulty of Paying Living Expenses: Not on file  Food Insecurity:   . Worried About Charity fundraiser in the Last Year: Not on file  . Ran Out of Food in the Last Year: Not on file  Transportation Needs:   . Lack  of Transportation (Medical): Not on file  . Lack of Transportation (Non-Medical): Not on file  Physical Activity:   . Days of Exercise per Week: Not on file  . Minutes of Exercise per Session: Not on file  Stress:   . Feeling of Stress : Not on file  Social Connections:   . Frequency of Communication with Friends and Family: Not on file  . Frequency of Social Gatherings with Friends and Family: Not on file  . Attends Religious Services: Not on file  . Active Member of Clubs or Organizations: Not on file  . Attends Archivist Meetings: Not on file  . Marital Status: Not on file  Intimate Partner Violence:   . Fear of Current or Ex-Partner: Not on file  . Emotionally Abused: Not on file  . Physically Abused: Not on file  . Sexually Abused: Not on file    Outpatient Medications Prior to Visit  Medication Sig Dispense Refill  . atorvastatin (LIPITOR) 20 MG tablet Take 1 tablet (20 mg total) by mouth daily. 90 tablet 3  . cephALEXin (KEFLEX) 500 MG capsule Take 1 capsule (500 mg total) by mouth 2 (two) times daily. (Patient not taking: Reported on 02/14/2020) 14 capsule 0  . HYDROcodone-acetaminophen (NORCO/VICODIN) 5-325 MG tablet Take 1 tablet  by mouth every 6 (six) hours as needed for severe pain. (Patient not taking: Reported on 02/14/2020) 15 tablet 0  . methocarbamol (ROBAXIN) 500 MG tablet Take 1 tablet (500 mg total) by mouth 4 (four) times daily. (Patient not taking: Reported on 07/04/2020) 60 tablet 0   No facility-administered medications prior to visit.    No Known Allergies  ROS Review of Systems  Constitutional: Positive for fatigue.  HENT: Negative.   Eyes: Negative.   Respiratory: Negative.   Cardiovascular: Negative.   Gastrointestinal: Negative.   Genitourinary: Negative.   Musculoskeletal: Negative.   Skin: Negative.   Neurological: Positive for dizziness and headaches.  Psychiatric/Behavioral: Negative.       Objective:    Physical  Exam Constitutional:      General: He is not in acute distress.    Appearance: Normal appearance. He is normal weight. He is not ill-appearing, toxic-appearing or diaphoretic.  Cardiovascular:     Rate and Rhythm: Normal rate and regular rhythm.     Heart sounds: Normal heart sounds. No murmur heard.  No friction rub. No gallop.   Pulmonary:     Effort: Pulmonary effort is normal. No respiratory distress.     Breath sounds: Normal breath sounds. No stridor. No wheezing, rhonchi or rales.  Chest:     Chest wall: No tenderness.  Neurological:     General: No focal deficit present.     Mental Status: He is alert and oriented to person, place, and time. Mental status is at baseline.  Psychiatric:        Mood and Affect: Mood normal.        Behavior: Behavior normal.        Thought Content: Thought content normal.        Judgment: Judgment normal.     BP 125/78   Pulse 71   Temp 97.7 F (36.5 C) (Temporal)   Resp 18   Ht 5\' 5"  (1.651 m)   Wt 170 lb (77.1 kg)   SpO2 98%   BMI 28.29 kg/m  Wt Readings from Last 3 Encounters:  07/04/20 170 lb (77.1 kg)  02/14/20 171 lb 9.6 oz (77.8 kg)  02/04/20 170 lb (77.1 kg)     Health Maintenance Due  Topic Date Due  . INFLUENZA VACCINE  07/23/2020    There are no preventive care reminders to display for this patient.  Lab Results  Component Value Date   TSH 3.820 07/04/2020   Lab Results  Component Value Date   WBC 14.5 (H) 02/05/2020   HGB 12.2 (L) 02/05/2020   HCT 37.9 (L) 02/05/2020   MCV 99.2 02/05/2020   PLT 187 02/05/2020   Lab Results  Component Value Date   NA 140 02/05/2020   K 4.3 02/05/2020   CO2 26 02/04/2020   GLUCOSE 115 (H) 02/05/2020   BUN 29 (H) 02/05/2020   CREATININE 1.30 (H) 02/05/2020   BILITOT 0.4 02/04/2020   ALKPHOS 32 (L) 02/04/2020   AST 22 02/04/2020   ALT 23 02/04/2020   PROT 7.9 02/04/2020   ALBUMIN 3.3 (L) 02/04/2020   CALCIUM 9.0 02/04/2020   ANIONGAP 8 02/04/2020   Lab Results   Component Value Date   CHOL 249 (H) 10/06/2019   Lab Results  Component Value Date   HDL 64 10/06/2019   Lab Results  Component Value Date   LDLCALC 167 (H) 10/06/2019   Lab Results  Component Value Date   TRIG 104 10/06/2019   Lab  Results  Component Value Date   CHOLHDL 3.9 10/06/2019   Lab Results  Component Value Date   HGBA1C 6.0 (H) 01/01/2018      Assessment & Plan:   Problem List Items Addressed This Visit    None    Visit Diagnoses    Thyroid disorder screening    -  Primary   Relevant Orders   Thyroid Panel With TSH (Completed)      No orders of the defined types were placed in this encounter.   Follow-up: No follow-ups on file.   PLAN  Symptoms seem to be consistent with mild symptomatic hypothyroidism. Past labs not available as they took place in [place]  Will draw labs today and initiate therapy if warranted  Return in 3 mo for check  Patient encouraged to call clinic with any questions, comments, or concerns.  Maximiano Coss, NP

## 2020-12-26 ENCOUNTER — Other Ambulatory Visit: Payer: Self-pay | Admitting: Registered Nurse

## 2020-12-26 DIAGNOSIS — E039 Hypothyroidism, unspecified: Secondary | ICD-10-CM

## 2021-10-04 ENCOUNTER — Other Ambulatory Visit: Payer: Self-pay | Admitting: Registered Nurse

## 2021-10-04 DIAGNOSIS — E039 Hypothyroidism, unspecified: Secondary | ICD-10-CM

## 2021-12-19 ENCOUNTER — Encounter: Payer: Self-pay | Admitting: Registered Nurse

## 2021-12-19 ENCOUNTER — Ambulatory Visit (INDEPENDENT_AMBULATORY_CARE_PROVIDER_SITE_OTHER): Payer: Medicare Other | Admitting: Registered Nurse

## 2021-12-19 VITALS — BP 140/80 | HR 74 | Temp 98.3°F | Resp 17 | Wt 171.4 lb

## 2021-12-19 DIAGNOSIS — Z1329 Encounter for screening for other suspected endocrine disorder: Secondary | ICD-10-CM | POA: Diagnosis not present

## 2021-12-19 DIAGNOSIS — E782 Mixed hyperlipidemia: Secondary | ICD-10-CM | POA: Diagnosis not present

## 2021-12-19 LAB — COMPREHENSIVE METABOLIC PANEL
ALT: 22 U/L (ref 0–53)
AST: 22 U/L (ref 0–37)
Albumin: 3.5 g/dL (ref 3.5–5.2)
Alkaline Phosphatase: 31 U/L — ABNORMAL LOW (ref 39–117)
BUN: 11 mg/dL (ref 6–23)
CO2: 26 mEq/L (ref 19–32)
Calcium: 8.8 mg/dL (ref 8.4–10.5)
Chloride: 100 mEq/L (ref 96–112)
Creatinine, Ser: 1.04 mg/dL (ref 0.40–1.50)
GFR: 72.36 mL/min (ref >60.00)
Glucose, Bld: 97 mg/dL (ref 70–99)
Potassium: 3.8 mEq/L (ref 3.5–5.1)
Sodium: 134 mEq/L — ABNORMAL LOW (ref 135–145)
Total Bilirubin: 0.5 mg/dL (ref 0.2–1.2)
Total Protein: 8.6 g/dL — ABNORMAL HIGH (ref 6.0–8.3)

## 2021-12-19 LAB — TSH: TSH: 2.22 u[IU]/mL (ref 0.35–5.50)

## 2021-12-19 LAB — LIPID PANEL
Cholesterol: 190 mg/dL (ref 0–200)
HDL: 52.5 mg/dL (ref >39.00)
LDL Cholesterol: 115 mg/dL — ABNORMAL HIGH (ref 0–99)
NonHDL: 137.47
Total CHOL/HDL Ratio: 4
Triglycerides: 112 mg/dL (ref 0.0–149.0)
VLDL: 22.4 mg/dL (ref 0.0–40.0)

## 2021-12-19 MED ORDER — ATORVASTATIN CALCIUM 20 MG PO TABS: 20.0000 mg | ORAL_TABLET | Freq: Every day | ORAL | 3 refills | Status: DC

## 2021-12-19 MED ORDER — LEVOTHYROXINE SODIUM 50 MCG PO TABS: 50.0000 ug | ORAL_TABLET | Freq: Every day | ORAL | 0 refills | Status: DC

## 2021-12-19 NOTE — Progress Notes (Signed)
Established Patient Office Visit  Subjective:  Patient ID: Glenn Lewis, male    DOB: Jan 06, 1951  Age: 70 y.o. MRN: [id number]  CC:  Chief Complaint  Patient presents with   Hypothyroidism    HPI [name] presents for thyroid follow up   No acute concerns  Lab Results  Component Value Date   TSH 3.820 07/04/2020   On synthroid 106mcg po qd Good effect, no AE  HLD On atorvastatin 20mg  po qd Lab Results  Component Value Date   CHOL 249 (H) 10/06/2019   HDL 64 10/06/2019   LDLCALC 167 (H) 10/06/2019   TRIG 104 10/06/2019   CHOLHDL 3.9 10/06/2019     Past Medical History:  Diagnosis Date   BPH (benign prostatic hyperplasia)    Hypertension    Hypothyroidism     Past Surgical History:  Procedure Laterality Date   CARPAL TUNNEL RELEASE Bilateral     Family History  Problem Relation Age of Onset   Hypertension Mother     Social History   Socioeconomic History   Marital status: Divorced    Spouse name: Not on file   Number of children: Not on file   Years of education: Not on file   Highest education level: Not on file  Occupational History   Not on file  Tobacco Use   Smoking status: Former   Smokeless tobacco: Never  Vaping Use   Vaping Use: Never used  Substance and Sexual Activity   Alcohol use: Yes    Comment: sometimes   Drug use: No   Sexual activity: Not on file  Other Topics Concern   Not on file  Social History Narrative   Not on file   Social Determinants of Health   Financial Resource Strain: Not on file  Food Insecurity: Not on file  Transportation Needs: Not on file  Physical Activity: Not on file  Stress: Not on file  Social Connections: Not on file  Intimate Partner Violence: Not on file    Outpatient Medications Prior to Visit  Medication Sig Dispense Refill   atorvastatin (LIPITOR) 20 MG tablet Take 1 tablet (20 mg total) by mouth daily. 90 tablet 3   levothyroxine (SYNTHROID) 25 MCG tablet TAKE 1 TABLET BY MOUTH  EVERY DAY BEFORE BREAKFAST 90 tablet 1   cephALEXin (KEFLEX) 500 MG capsule Take 1 capsule (500 mg total) by mouth 2 (two) times daily. (Patient not taking: Reported on 02/14/2020) 14 capsule 0   HYDROcodone-acetaminophen (NORCO/VICODIN) 5-325 MG tablet Take 1 tablet by mouth every 6 (six) hours as needed for severe pain. (Patient not taking: Reported on 02/14/2020) 15 tablet 0   methocarbamol (ROBAXIN) 500 MG tablet Take 1 tablet (500 mg total) by mouth 4 (four) times daily. (Patient not taking: Reported on 07/04/2020) 60 tablet 0   No facility-administered medications prior to visit.    No Known Allergies  ROS Review of Systems  Constitutional: Negative.   HENT: Negative.    Eyes: Negative.   Respiratory: Negative.    Cardiovascular: Negative.   Gastrointestinal: Negative.   Genitourinary: Negative.   Musculoskeletal: Negative.   Skin: Negative.   Neurological: Negative.   Psychiatric/Behavioral: Negative.    All other systems reviewed and are negative.    Objective:    Physical Exam Constitutional:      General: He is not in acute distress.    Appearance: Normal appearance. He is normal weight. He is not ill-appearing, toxic-appearing or diaphoretic.  Cardiovascular:  Rate and Rhythm: Normal rate and regular rhythm.     Heart sounds: Normal heart sounds. No murmur heard.   No friction rub. No gallop.  Pulmonary:     Effort: Pulmonary effort is normal. No respiratory distress.     Breath sounds: Normal breath sounds. No stridor. No wheezing, rhonchi or rales.  Chest:     Chest wall: No tenderness.  Neurological:     General: No focal deficit present.     Mental Status: He is alert and oriented to person, place, and time. Mental status is at baseline.  Psychiatric:        Mood and Affect: Mood normal.        Behavior: Behavior normal.        Thought Content: Thought content normal.        Judgment: Judgment normal.    BP 140/80    Pulse 74    Temp 98.3 F (36.8 C)     Resp 17    Wt 171 lb 6.4 oz (77.7 kg)    SpO2 98%    BMI 28.52 kg/m  Wt Readings from Last 3 Encounters:  12/19/21 171 lb 6.4 oz (77.7 kg)  07/04/20 170 lb (77.1 kg)  02/14/20 171 lb 9.6 oz (77.8 kg)     Health Maintenance Due  Topic Date Due   Hepatitis C Screening  Never done   Zoster Vaccines- Shingrix (1 of 2) Never done   Pneumonia Vaccine 15+ Years old (1 - PCV) Never done   COVID-19 Vaccine (4 - Booster for Pfizer series) 02/20/2021    There are no preventive care reminders to display for this patient.  Lab Results  Component Value Date   TSH 3.820 07/04/2020   Lab Results  Component Value Date   WBC 14.5 (H) 02/05/2020   HGB 12.2 (L) 02/05/2020   HCT 37.9 (L) 02/05/2020   MCV 99.2 02/05/2020   PLT 187 02/05/2020   Lab Results  Component Value Date   NA 140 02/05/2020   K 4.3 02/05/2020   CO2 26 02/04/2020   GLUCOSE 115 (H) 02/05/2020   BUN 29 (H) 02/05/2020   CREATININE 1.30 (H) 02/05/2020   BILITOT 0.4 02/04/2020   ALKPHOS 32 (L) 02/04/2020   AST 22 02/04/2020   ALT 23 02/04/2020   PROT 7.9 02/04/2020   ALBUMIN 3.3 (L) 02/04/2020   CALCIUM 9.0 02/04/2020   ANIONGAP 8 02/04/2020   Lab Results  Component Value Date   CHOL 249 (H) 10/06/2019   Lab Results  Component Value Date   HDL 64 10/06/2019   Lab Results  Component Value Date   LDLCALC 167 (H) 10/06/2019   Lab Results  Component Value Date   TRIG 104 10/06/2019   Lab Results  Component Value Date   CHOLHDL 3.9 10/06/2019   Lab Results  Component Value Date   HGBA1C 6.0 (H) 01/01/2018      Assessment & Plan:   Problem List Items Addressed This Visit   None Visit Diagnoses     Thyroid disorder screening    -  Primary   Relevant Medications   levothyroxine (SYNTHROID) 50 MCG tablet   Other Relevant Orders   Comprehensive metabolic panel   TSH   Mixed hyperlipidemia       Relevant Medications   atorvastatin (LIPITOR) 20 MG tablet   Other Relevant Orders    Comprehensive metabolic panel   Lipid panel       Meds ordered this encounter  Medications   atorvastatin (LIPITOR) 20 MG tablet    Sig: Take 1 tablet (20 mg total) by mouth daily.    Dispense:  90 tablet    Refill:  3    Order Specific Question:   Supervising Provider    Answer:   Carlota Raspberry, JEFFREY R [[id number]]   levothyroxine (SYNTHROID) 50 MCG tablet    Sig: Take 1 tablet (50 mcg total) by mouth daily.    Dispense:  90 tablet    Refill:  0    Order Specific Question:   Supervising Provider    Answer:   Carlota Raspberry, JEFFREY R [[id number]]    Follow-up: Return in about 1 year (around 12/19/2022) for TSH, lipids.   PLAN Recheck tsh, lipids, metabolic panel Continue current meds if labs steady. Return annually  Patient encouraged to call clinic with any questions, comments, or concerns.  Maximiano Coss, NP

## 2021-12-19 NOTE — Patient Instructions (Signed)
[name] to see you!  Let's ensure labs are looking stable.  If so, see you annually, if not, we'll adjust medications and recheck in 3 months.  Thank you  Glenn Lewis

## 2022-03-16 ENCOUNTER — Other Ambulatory Visit: Payer: Self-pay | Admitting: Registered Nurse

## 2022-03-16 DIAGNOSIS — Z1329 Encounter for screening for other suspected endocrine disorder: Secondary | ICD-10-CM

## 2022-04-10 ENCOUNTER — Telehealth: Payer: Self-pay | Admitting: Registered Nurse

## 2022-04-10 NOTE — Telephone Encounter (Signed)
Left message for patient to call back and schedule Medicare Annual Wellness Visit (AWV) in office.  ° °If not able to come in office, please offer to do virtually or by telephone.  Left office number and my jabber #[phone]. ° °Due for AWVI ° °Please schedule at anytime with Nurse Health Advisor. °  °

## 2022-07-10 ENCOUNTER — Encounter: Payer: Self-pay | Admitting: Registered Nurse

## 2022-07-10 ENCOUNTER — Other Ambulatory Visit: Payer: Self-pay

## 2022-07-10 ENCOUNTER — Ambulatory Visit (INDEPENDENT_AMBULATORY_CARE_PROVIDER_SITE_OTHER): Payer: Medicare Other | Admitting: Registered Nurse

## 2022-07-10 VITALS — BP 112/66 | HR 70 | Temp 97.6°F | Resp 18 | Ht 65.0 in | Wt 162.4 lb

## 2022-07-10 DIAGNOSIS — R42 Dizziness and giddiness: Secondary | ICD-10-CM | POA: Diagnosis not present

## 2022-07-10 DIAGNOSIS — R062 Wheezing: Secondary | ICD-10-CM

## 2022-07-10 DIAGNOSIS — E782 Mixed hyperlipidemia: Secondary | ICD-10-CM

## 2022-07-10 LAB — CBC WITH DIFFERENTIAL/PLATELET
Basophils Absolute: 0 10*3/uL (ref 0.0–0.1)
Basophils Relative: 0.3 % (ref 0.0–3.0)
Eosinophils Absolute: 0.2 10*3/uL (ref 0.0–0.7)
Eosinophils Relative: 3.1 % (ref 0.0–5.0)
HCT: 36.9 % — ABNORMAL LOW (ref 39.0–52.0)
Hemoglobin: 12.3 g/dL — ABNORMAL LOW (ref 13.0–17.0)
Lymphocytes Relative: 31.2 % (ref 12.0–46.0)
Lymphs Abs: 2 10*3/uL (ref 0.7–4.0)
MCHC: 33.2 g/dL (ref 30.0–36.0)
MCV: 97.4 fl (ref 78.0–100.0)
Monocytes Absolute: 0.6 10*3/uL (ref 0.1–1.0)
Monocytes Relative: 9.9 % (ref 3.0–12.0)
Neutro Abs: 3.6 10*3/uL (ref 1.4–7.7)
Neutrophils Relative %: 55.5 % (ref 43.0–77.0)
Platelets: 195 10*3/uL (ref 150.0–400.0)
RBC: 3.79 Mil/uL — ABNORMAL LOW (ref 4.22–5.81)
RDW: 13.2 % (ref 11.5–15.5)
WBC: 6.5 10*3/uL (ref 4.0–10.5)

## 2022-07-10 LAB — COMPREHENSIVE METABOLIC PANEL
ALT: 12 U/L (ref 0–53)
AST: 14 U/L (ref 0–37)
Albumin: 3.5 g/dL (ref 3.5–5.2)
Alkaline Phosphatase: 32 U/L — ABNORMAL LOW (ref 39–117)
BUN: 18 mg/dL (ref 6–23)
CO2: 28 mEq/L (ref 19–32)
Calcium: 9 mg/dL (ref 8.4–10.5)
Chloride: 99 mEq/L (ref 96–112)
Creatinine, Ser: 0.96 mg/dL (ref 0.40–1.50)
GFR: 79.34 mL/min (ref >60.00)
Glucose, Bld: 117 mg/dL — ABNORMAL HIGH (ref 70–99)
Potassium: 3.5 mEq/L (ref 3.5–5.1)
Sodium: 134 mEq/L — ABNORMAL LOW (ref 135–145)
Total Bilirubin: 0.3 mg/dL (ref 0.2–1.2)
Total Protein: 9 g/dL — ABNORMAL HIGH (ref 6.0–8.3)

## 2022-07-10 LAB — LIPID PANEL
Cholesterol: 238 mg/dL — ABNORMAL HIGH (ref 0–200)
HDL: 60.4 mg/dL (ref >39.00)
LDL Cholesterol: 158 mg/dL — ABNORMAL HIGH (ref 0–99)
NonHDL: 177.75
Total CHOL/HDL Ratio: 4
Triglycerides: 99 mg/dL (ref 0.0–149.0)
VLDL: 19.8 mg/dL (ref 0.0–40.0)

## 2022-07-10 MED ORDER — PREDNISONE 10 MG (21) PO TBPK: ORAL_TABLET | ORAL | 0 refills | Status: DC

## 2022-07-10 MED ORDER — DOXYCYCLINE HYCLATE 100 MG PO TABS: 100.0000 mg | ORAL_TABLET | Freq: Two times a day (BID) | ORAL | 0 refills | Status: DC

## 2022-07-10 NOTE — Progress Notes (Signed)
Acute Office Visit  Subjective:    Patient ID: Glenn Lewis, male    DOB: Apr 26, 1951, 71 y.o.   MRN: [id number]  Chief Complaint  Patient presents with   Pain    Patient states he has been having pain or inflammation all in his joints and also noticed him getting dizzy    HPI Patient is in today for dizzy spells, joint pain  Ongoing for a few days.  He does not note any events preceding these symptoms  Dizziness has remained stable x 1 mo. No chest pain, palpitations, claudication, dependent edema, headaches, visual changes.  Widespread aches and pains through multiple joints. Notes this onset after a flu about 1 mo ago Has improved since onset.  Outpatient Medications Prior to Visit  Medication Sig Dispense Refill   levothyroxine (SYNTHROID) 50 MCG tablet TAKE 1 TABLET BY MOUTH EVERY DAY 90 tablet 0   atorvastatin (LIPITOR) 20 MG tablet Take 1 tablet (20 mg total) by mouth daily. (Patient not taking: Reported on 07/10/2022) 90 tablet 3   No facility-administered medications prior to visit.    Review of Systems  Constitutional: Negative.   HENT: Negative.    Eyes: Negative.   Respiratory: Negative.    Cardiovascular: Negative.   Gastrointestinal: Negative.   Genitourinary: Negative.   Musculoskeletal: Negative.   Skin: Negative.   Neurological: Negative.   Psychiatric/Behavioral: Negative.    All other systems reviewed and are negative.      Objective:    BP 112/66   Pulse 70   Temp 97.6 F (36.4 C) (Temporal)   Resp 18   Ht '5\' 5"'$  (1.651 m)   Wt 162 lb 6.4 oz (73.7 kg)   SpO2 99%   BMI 27.02 kg/m  Physical Exam Constitutional:      General: He is not in acute distress.    Appearance: Normal appearance. He is normal weight. He is not ill-appearing, toxic-appearing or diaphoretic.  Cardiovascular:     Rate and Rhythm: Normal rate and regular rhythm.     Heart sounds: Normal heart sounds. No murmur heard.    No friction rub. No gallop.  Pulmonary:      Effort: Pulmonary effort is normal. No respiratory distress.     Breath sounds: No stridor. Wheezing (bibasalar) present. No rhonchi or rales.  Chest:     Chest wall: No tenderness.  Neurological:     General: No focal deficit present.     Mental Status: He is alert and oriented to person, place, and time. Mental status is at baseline.  Psychiatric:        Mood and Affect: Mood normal.        Behavior: Behavior normal.        Thought Content: Thought content normal.        Judgment: Judgment normal.     Results for orders placed or performed in visit on 07/10/22  CBC with Differential/Platelet  Result Value Ref Range   WBC 6.5 4.0 - 10.5 K/uL   RBC 3.79 (L) 4.22 - 5.81 Mil/uL   Hemoglobin 12.3 (L) 13.0 - 17.0 g/dL   HCT 36.9 (L) 39.0 - 52.0 %   MCV 97.4 78.0 - 100.0 fl   MCHC 33.2 30.0 - 36.0 g/dL   RDW 13.2 11.5 - 15.5 %   Platelets 195.0 150.0 - 400.0 K/uL   Neutrophils Relative % 55.5 43.0 - 77.0 %   Lymphocytes Relative 31.2 12.0 - 46.0 %   Monocytes Relative 9.9  3.0 - 12.0 %   Eosinophils Relative 3.1 0.0 - 5.0 %   Basophils Relative 0.3 0.0 - 3.0 %   Neutro Abs 3.6 1.4 - 7.7 K/uL   Lymphs Abs 2.0 0.7 - 4.0 K/uL   Monocytes Absolute 0.6 0.1 - 1.0 K/uL   Eosinophils Absolute 0.2 0.0 - 0.7 K/uL   Basophils Absolute 0.0 0.0 - 0.1 K/uL  Comprehensive metabolic panel  Result Value Ref Range   Sodium 134 (L) 135 - 145 mEq/L   Potassium 3.5 3.5 - 5.1 mEq/L   Chloride 99 96 - 112 mEq/L   CO2 28 19 - 32 mEq/L   Glucose, Bld 117 (H) 70 - 99 mg/dL   BUN 18 6 - 23 mg/dL   Creatinine, Ser 0.96 0.40 - 1.50 mg/dL   Total Bilirubin 0.3 0.2 - 1.2 mg/dL   Alkaline Phosphatase 32 (L) 39 - 117 U/L   AST 14 0 - 37 U/L   ALT 12 0 - 53 U/L   Total Protein 9.0 (H) 6.0 - 8.3 g/dL   Albumin 3.5 3.5 - 5.2 g/dL   GFR 79.34 >60.00 mL/min   Calcium 9.0 8.4 - 10.5 mg/dL  Lipid panel  Result Value Ref Range   Cholesterol 238 (H) 0 - 200 mg/dL   Triglycerides 99.0 0.0 - 149.0 mg/dL    HDL 60.40 >39.00 mg/dL   VLDL 19.8 0.0 - 40.0 mg/dL   LDL Cholesterol 158 (H) 0 - 99 mg/dL   Total CHOL/HDL Ratio 4    NonHDL 177.75         Assessment & Plan:  1. Dizzy spells - EKG 12-Lead - CBC with Differential/Platelet - Comprehensive metabolic panel  2. Wheezing - doxycycline (VIBRA-TABS) 100 MG tablet; Take 1 tablet (100 mg total) by mouth 2 (two) times daily.  Dispense: 20 tablet; Refill: 0 - predniSONE (STERAPRED UNI-PAK 21 TAB) 10 MG (21) TBPK tablet; Take per package instructions. Do not skip doses. Finish entire supply.  Dispense: 1 each; Refill: 0 - CBC with Differential/Platelet - Comprehensive metabolic panel  3. Mixed hyperlipidemia - Lipid panel    Meds ordered this encounter  Medications   doxycycline (VIBRA-TABS) 100 MG tablet    Sig: Take 1 tablet (100 mg total) by mouth 2 (two) times daily.    Dispense:  20 tablet    Refill:  0    Order Specific Question:   Supervising Provider    Answer:   Carlota Raspberry, JEFFREY R [[id number]]   predniSONE (STERAPRED UNI-PAK 21 TAB) 10 MG (21) TBPK tablet    Sig: Take per package instructions. Do not skip doses. Finish entire supply.    Dispense:  1 each    Refill:  0    Order Specific Question:   Supervising Provider    Answer:   Carlota Raspberry, JEFFREY R [[id number]]    Return if symptoms worsen or fail to improve.  PLAN Ekg compared to 04/09/2017. No acute changes. RRR, no signs of acute events or ischemia. Wheezing in lungs suspicious for infection. Will treat as such as above. Labs collected. Will follow up with the patient as warranted. Patient encouraged to call clinic with any questions, comments, or concerns.   Maximiano Coss, NP

## 2022-07-10 NOTE — Patient Instructions (Signed)
Mr. Bradie Lacock to see you!  Good luck going forward - see one of these providers:  Inda Coke, PA Dimas Chyle, MD Agustina Caroli, MD Myrna Blazer Early, NP Jeralyn Ruths, DNP  Take the medications until you run out. You should feel better then!  Thank you for letting me take part in your care.  Rich

## 2022-07-16 ENCOUNTER — Other Ambulatory Visit: Payer: Self-pay | Admitting: Registered Nurse

## 2022-07-16 DIAGNOSIS — Z1329 Encounter for screening for other suspected endocrine disorder: Secondary | ICD-10-CM

## 2022-12-05 ENCOUNTER — Telehealth: Payer: Self-pay

## 2022-12-05 DIAGNOSIS — Z1329 Encounter for screening for other suspected endocrine disorder: Secondary | ICD-10-CM

## 2022-12-05 MED ORDER — LEVOTHYROXINE SODIUM 50 MCG PO TABS: 50.0000 ug | ORAL_TABLET | Freq: Every day | ORAL | 0 refills | Status: DC

## 2022-12-05 NOTE — Telephone Encounter (Signed)
Pt son called in requesting refill levothyroxine and noted he had not yet found a new pcp advised can not continue to do refills he will need to establish somewhere else or call in the new year and get on with [place]

## 2022-12-18 ENCOUNTER — Encounter: Payer: Medicare Other | Admitting: Registered Nurse

## 2022-12-20 ENCOUNTER — Encounter: Payer: Medicare Other | Admitting: Registered Nurse

## 2023-01-30 ENCOUNTER — Other Ambulatory Visit: Payer: Self-pay | Admitting: Family Medicine

## 2023-01-30 DIAGNOSIS — Z1329 Encounter for screening for other suspected endocrine disorder: Secondary | ICD-10-CM

## 2023-02-11 ENCOUNTER — Ambulatory Visit: Payer: Medicare HMO | Admitting: Family Medicine

## 2023-02-11 ENCOUNTER — Encounter: Payer: Self-pay | Admitting: Family Medicine

## 2023-02-11 VITALS — BP 122/68 | HR 67 | Temp 98.4°F | Ht 65.0 in | Wt 159.2 lb

## 2023-02-11 DIAGNOSIS — Z23 Encounter for immunization: Secondary | ICD-10-CM

## 2023-02-11 DIAGNOSIS — E782 Mixed hyperlipidemia: Secondary | ICD-10-CM

## 2023-02-11 DIAGNOSIS — E039 Hypothyroidism, unspecified: Secondary | ICD-10-CM | POA: Insufficient documentation

## 2023-02-11 DIAGNOSIS — Z1159 Encounter for screening for other viral diseases: Secondary | ICD-10-CM | POA: Diagnosis not present

## 2023-02-11 DIAGNOSIS — Z1329 Encounter for screening for other suspected endocrine disorder: Secondary | ICD-10-CM

## 2023-02-11 DIAGNOSIS — E278 Other specified disorders of adrenal gland: Secondary | ICD-10-CM

## 2023-02-11 LAB — COMPREHENSIVE METABOLIC PANEL
ALT: 14 U/L (ref 0–53)
AST: 16 U/L (ref 0–37)
Albumin: 3.6 g/dL (ref 3.5–5.2)
Alkaline Phosphatase: 32 U/L — ABNORMAL LOW (ref 39–117)
BUN: 25 mg/dL — ABNORMAL HIGH (ref 6–23)
CO2: 29 mEq/L (ref 19–32)
Calcium: 9.6 mg/dL (ref 8.4–10.5)
Chloride: 99 mEq/L (ref 96–112)
Creatinine, Ser: 1.04 mg/dL (ref 0.40–1.50)
GFR: 71.78 mL/min (ref >60.00)
Glucose, Bld: 96 mg/dL (ref 70–99)
Potassium: 4.9 mEq/L (ref 3.5–5.1)
Sodium: 134 mEq/L — ABNORMAL LOW (ref 135–145)
Total Bilirubin: 0.3 mg/dL (ref 0.2–1.2)
Total Protein: 9.7 g/dL — ABNORMAL HIGH (ref 6.0–8.3)

## 2023-02-11 LAB — LIPID PANEL
Cholesterol: 262 mg/dL — ABNORMAL HIGH (ref 0–200)
HDL: 57.4 mg/dL (ref >39.00)
LDL Cholesterol: 181 mg/dL — ABNORMAL HIGH (ref 0–99)
NonHDL: 204.83
Total CHOL/HDL Ratio: 5
Triglycerides: 118 mg/dL (ref 0.0–149.0)
VLDL: 23.6 mg/dL (ref 0.0–40.0)

## 2023-02-11 LAB — TSH: TSH: 2.09 u[IU]/mL (ref 0.35–5.50)

## 2023-02-11 MED ORDER — LEVOTHYROXINE SODIUM 50 MCG PO TABS: 50.0000 ug | ORAL_TABLET | Freq: Every day | ORAL | 1 refills | Status: DC

## 2023-02-11 NOTE — Addendum Note (Signed)
Addended by: Evangeline Gula on: 02/11/2023 04:46 PM   Modules accepted: Orders

## 2023-02-11 NOTE — Assessment & Plan Note (Signed)
Patient denies any abdominal pain. 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 Patient currently denies being under the care of urology.

## 2023-02-11 NOTE — Assessment & Plan Note (Addendum)
Chronic. Patient was previously on Atorvastatin 86m, 1 tablet once a day for hyperlipidemia. However, he discontinued taking medication because it was making him weak. He has tried Omega-3 in the past and is trying to maintain a healthy diet. He denies eating fried foods. Last lipids were elevated in July 2023. Currently has a ASCVD score of 19.1. If lipids are elevated, will recommend Rosuvastatin 181m 1 tablet a day or Zetia 1011m1 tablet a day. If he does not agree to medication management, will offer cardiology referral. Ordered lipids and CMP to assess his liver function for possibly restarting a statin if patient will agree.

## 2023-02-11 NOTE — Assessment & Plan Note (Signed)
Chronic. Patient is currently taking Levothyroxine 50 mcg, 1 tablet daily. Last took medication yesterday because he ran out of medication. Last TSH was stable in December 2022. Will recheck TSH today and refill medication according to his TSH results. Recommend to continue with Levothyroxine.

## 2023-02-11 NOTE — Progress Notes (Signed)
New Patient Office Visit  Subjective    Patient ID: Glenn Lewis, male    DOB: Sep 18, 1951  Age: 72 y.o. MRN: [id number]  CC:  Chief Complaint  Patient presents with   Establish Care    Pt needs thyroid med refill     HPI Glenn Lewis presents to establish care with new provider and chronic management.   Hypothyroid: Chronic. Patient is taking Levothyroxine 50 mcg every morning. Last took medication yesterday, Glenn Lewis has ran out. Glenn Lewis denies any symptoms of hypothyroid, such as fatigue, chest pain, hair loss. Last TSH was 2.22 back in December 2022.   Hyperlipidemia: Chronic. Patient was previously taking Atorvastatin 92m for hyperlipidemia. Glenn Lewis reports medication was making him weak, therefore Glenn Lewis discontinued medication. Glenn Lewis has not been on medication at least since last July. Glenn Lewis reports Glenn Lewis has tried Omega-3 and has been working on his diet. Glenn Lewis denies eating fried foods.  Lab Results  Component Value Date   CHOL 238 (H) 07/10/2022   HDL 60.40 07/10/2022   LDLCALC 158 (H) 07/10/2022   TRIG 99.0 07/10/2022   CHOLHDL 4 07/10/2022    The 10-year ASCVD risk score (Arnett DK, et al., 2019) is: 19.1%   Values used to calculate the score:     Age: [age]years     Sex: Male     Is Non-Hispanic African American: No     Diabetic: No     Tobacco smoker: No     Systolic Blood Pressure: 1123XX123mmHg     Is BP treated: No     HDL Cholesterol: 60.4 mg/dL     Total Cholesterol: 238 mg/dL   Adrenal Mass: According to [facility]12[facility] is [facility] patient has a 2.1 and 1.6 cm right adrenal nodule. [name], [name]. Based on imaging, Glenn Lewis had a MRI of the abd back in January 2019 with [facility]. The scan results mention there was a right adrenal mass represents an adenoma. Also, had a CT abd while at a ED visit in February 2021. Scan  just mentions the right adrenal adenoma without measurement. Patient denies that Glenn Lewis is still under the care of urology. Glenn Lewis denies any abd pain.   Outpatient Encounter Medications as of 02/11/2023  Medication Sig   levothyroxine (SYNTHROID) 50 MCG tablet Take 1 tablet (50 mcg total) by mouth daily.   [DISCONTINUED] doxycycline (VIBRA-TABS) 100 MG tablet Take 1 tablet (100 mg total) by mouth 2 (two) times daily. (Patient not taking: Reported on 02/11/2023)   [DISCONTINUED] predniSONE (STERAPRED UNI-PAK 21 TAB) 10 MG (21) TBPK tablet Take per package instructions. Do not skip doses. Finish entire supply. (Patient not taking: Reported on 02/11/2023)   No facility-administered encounter medications on file as of 02/11/2023.    Past Medical History:  Diagnosis Date   BPH (benign prostatic hyperplasia)    Hypertension    Hypothyroidism     Past Surgical History:  Procedure Laterality Date   CARPAL TUNNEL RELEASE Bilateral     Family History  Problem Relation Age of Onset   Hypertension Mother     Social History   Socioeconomic History   Marital status: Divorced    Spouse name: Not on file   Number of children: Not on file   Years of education: Not on file   Highest  education level: Not on file  Occupational History   Not on file  Tobacco Use   Smoking status: Former   Smokeless tobacco: Never  Vaping Use   Vaping Use: Never used  Substance and Sexual Activity   Alcohol use: Yes    Comment: sometimes   Drug use: No   Sexual activity: Not on file  Other Topics Concern   Not on file  Social History Narrative   Not on file   Social Determinants of Health   Financial Resource Strain: Not on file  Food Insecurity: Not on file  Transportation Needs: Not on file  Physical Activity: Not on file  Stress: Not on file  Social Connections: Not on file  Intimate Partner Violence: Not on file    ROS See HPI above     Objective    BP 122/68   Pulse 67   Temp 98.4 F (36.9  C) (Temporal)   Ht 5' 5"$  (1.651 m)   Wt 159 lb 3.2 oz (72.2 kg)   SpO2 98%   BMI 26.49 kg/m   Physical Exam Constitutional:      General: Glenn Lewis is not in acute distress.    Appearance: Normal appearance. Glenn Lewis is not ill-appearing.  HENT:     Head: Normocephalic and atraumatic.  Eyes:     Comments: Wearing glasses  Cardiovascular:     Rate and Rhythm: Normal rate and regular rhythm.     Heart sounds: Normal heart sounds. No murmur heard.    No friction rub. No gallop.  Pulmonary:     Effort: Pulmonary effort is normal. No respiratory distress.     Breath sounds: Normal breath sounds.  Abdominal:     Tenderness: There is no right CVA tenderness or left CVA tenderness.  Musculoskeletal:        General: Normal range of motion.  Skin:    General: Skin is warm and dry.  Neurological:     General: No focal deficit present.     Mental Status: Glenn Lewis is alert and oriented to person, place, and time.  Psychiatric:        Mood and Affect: Mood normal.        Behavior: Behavior normal.        Thought Content: Thought content normal.        Judgment: Judgment normal.      Assessment & Plan:  Follow up in 6 months for a physical. Follow up for a AWV to be completed by health coach. After visit, it was brought to this providers attention, this would be his first AWV. This will need to be a separate appointment from his physical.  Patients primary language is spanish. Offered to have a spanish interpreter available, but patient declined services.  First Shingrix vaccine was administered. Glenn Lewis will need second vaccine in 3 months as a nurse visit.  Prevnar-20 vaccine was administered. Informed patient to take Tylenol 1034m every 8 hours and alternate Ibuprofen 8076mevery 8 hours for body aches or fever from vaccines.  Hepatitis C screening completed. Office will call with results.  Patient agreed to treatment plan and has no further concerns.   JoValarie MerinoNP

## 2023-02-11 NOTE — Patient Instructions (Addendum)
-  It was a pleasure to meet you today and I look forward to taking care of you!  -Checking TSH level for thyroid to make sure you are taking the correct dose. Will refill medication once lab results is back. Checking cholesterol to see if it has improved. Will provide you recommendations if labs are abnormal.  -You received your first shingles vaccine and will need to come back in 3 months for your second vaccine.  -You received your pneumonia vaccine today. If you have any body aches or fever from them, recommend Tylenol 1018m every 8 hours and alternate Ibuprofen 8049mevery 8 hours.  -Screening for Hepatitis C. This is completed once a life time and should not need to be screened later.  -Follow up in 6 months for your physical exam. ........................................................Marland Kitchen

## 2023-02-12 ENCOUNTER — Telehealth: Payer: Self-pay

## 2023-02-12 ENCOUNTER — Other Ambulatory Visit: Payer: Self-pay

## 2023-02-12 DIAGNOSIS — E782 Mixed hyperlipidemia: Secondary | ICD-10-CM

## 2023-02-12 LAB — HEPATITIS C ANTIBODY: Hepatitis C Ab: NONREACTIVE

## 2023-02-12 MED ORDER — ROSUVASTATIN CALCIUM 10 MG PO TABS: 10.0000 mg | ORAL_TABLET | Freq: Every day | ORAL | 1 refills | Status: DC

## 2023-02-12 NOTE — Telephone Encounter (Signed)
Pt informed and pt will call back to schedule lab visit for 03/27/23

## 2023-02-12 NOTE — Telephone Encounter (Signed)
Patient is returning a phone call from [facility]

## 2023-02-12 NOTE — Telephone Encounter (Signed)
Left pt a VM to call office in regards to lab results . Rosuvastatin 10 mg sent to pharmacy and repeat liver function order is in . Pt will need a lab visit for 6 weeks unless he has an upcoming apt w/ [facility]

## 2023-02-12 NOTE — Telephone Encounter (Signed)
-----   Message from Evangeline Gula, NP sent at 02/11/2023  4:45 PM EST ----- Total cholesterol and bad cholesterol is elevated, higher than 7 months ago. Previously on Atorvastatin 35m, but discontinued because of weakness. Recommend to change statin to hopefully a more tolerable statin. Recommend Rosuvastatin 120m 1 tablet at bedtime. If he agrees to statin, recommend to recheck liver enzymes in 6 weeks. Recommend to decrease greasy, fatty foods. Limit fast food. Recommend a regular exercise regiment.  Sodium is barely low, but been the same for the last year. Will continue to monitor. Alkaline Phosphatase is slightly low, but been the same for the last year. Recommend to eat a well balanced diet.   TSH is normal limits. Will refill Levothyroxine at the same dosage.  Protein level is elevated, recommend to promote hydration. Will continue to monitor.

## 2023-02-19 ENCOUNTER — Other Ambulatory Visit (INDEPENDENT_AMBULATORY_CARE_PROVIDER_SITE_OTHER): Payer: Medicare HMO

## 2023-02-19 DIAGNOSIS — E782 Mixed hyperlipidemia: Secondary | ICD-10-CM

## 2023-02-19 LAB — HEPATIC FUNCTION PANEL
ALT: 13 U/L (ref 0–53)
AST: 19 U/L (ref 0–37)
Albumin: 3.3 g/dL — ABNORMAL LOW (ref 3.5–5.2)
Alkaline Phosphatase: 34 U/L — ABNORMAL LOW (ref 39–117)
Bilirubin, Direct: 0 mg/dL (ref 0.0–0.3)
Total Bilirubin: 0.3 mg/dL (ref 0.2–1.2)
Total Protein: 9.3 g/dL — ABNORMAL HIGH (ref 6.0–8.3)

## 2023-02-20 ENCOUNTER — Other Ambulatory Visit: Payer: Self-pay

## 2023-02-20 ENCOUNTER — Telehealth: Payer: Self-pay

## 2023-02-20 DIAGNOSIS — E782 Mixed hyperlipidemia: Secondary | ICD-10-CM

## 2023-02-20 NOTE — Telephone Encounter (Signed)
-----   Message from Evangeline Gula, NP sent at 02/19/2023  3:20 PM EST ----- Hepatic function is stable. We need to recheck liver function in 6 weeks with starting Rosuvastatin

## 2023-02-20 NOTE — Telephone Encounter (Signed)
Left pt lab results on vm ask him to call office to make a lab only visit repeat liver function order is in

## 2023-05-14 ENCOUNTER — Ambulatory Visit (INDEPENDENT_AMBULATORY_CARE_PROVIDER_SITE_OTHER): Payer: Medicare HMO

## 2023-05-14 DIAGNOSIS — Z23 Encounter for immunization: Secondary | ICD-10-CM

## 2023-05-14 NOTE — Progress Notes (Signed)
Patient was here for shingles vaccines number 2 today administered without issue ABN signed at time of injection and was explained to patient he expressed understanding

## 2023-07-16 ENCOUNTER — Ambulatory Visit (INDEPENDENT_AMBULATORY_CARE_PROVIDER_SITE_OTHER): Payer: Medicare HMO | Admitting: Family Medicine

## 2023-07-16 ENCOUNTER — Encounter: Payer: Self-pay | Admitting: Family Medicine

## 2023-07-16 VITALS — BP 102/60 | HR 76 | Temp 98.2°F | Ht 65.0 in | Wt 160.2 lb

## 2023-07-16 DIAGNOSIS — Z1211 Encounter for screening for malignant neoplasm of colon: Secondary | ICD-10-CM | POA: Diagnosis not present

## 2023-07-16 DIAGNOSIS — Z0000 Encounter for general adult medical examination without abnormal findings: Secondary | ICD-10-CM

## 2023-07-16 NOTE — Progress Notes (Addendum)
Subjective:   Glenn Lewis is a 72 y.o. male who presents for an Initial Medicare Annual Wellness Visit.  Visit Complete: In person  Patient Medicare AWV questionnaire was completed by the patient on 07/16/2023; I have confirmed that all information answered by patient is correct and no changes since this date.  Review of Systems    +Headache +Dizzy  Cardiac Risk Factors include: advanced age (>59men, >30 women);male gender     Objective:    Today's Vitals   07/16/23 0753  BP: 102/60  Pulse: 76  Temp: 98.2 F (36.8 C)  SpO2: 98%  Weight: 160 lb 4 oz (72.7 kg)  Height: 5\' 5"  (1.651 m)   Body mass index is 26.67 kg/m.     02/04/2020   11:17 PM  Advanced Directives  Does Patient Have a Medical Advance Directive? No  Would patient like information on creating a medical advance directive? No - Patient declined    Current Medications (verified) Outpatient Encounter Medications as of 07/16/2023  Medication Sig   Ascorbic Acid (VITAMIN C) 1000 MG tablet Take 1,000 mg by mouth daily.   levothyroxine (SYNTHROID) 50 MCG tablet Take 50 mcg by mouth daily before breakfast.   Magnesium 200 MG TABS Take 1 tablet by mouth daily.   Menaquinone-7 (VITAMIN K2 PO) Take 1 tablet by mouth daily.   VITAMIN D PO Take 1 tablet by mouth daily.   [DISCONTINUED] levothyroxine (SYNTHROID) 50 MCG tablet Take 1 tablet (50 mcg total) by mouth daily.   [DISCONTINUED] rosuvastatin (CRESTOR) 10 MG tablet Take 1 tablet (10 mg total) by mouth daily. (Patient not taking: Reported on 07/16/2023)   No facility-administered encounter medications on file as of 07/16/2023.   Allergies (verified) Patient has no known allergies.   History: Past Medical History:  Diagnosis Date   BPH (benign prostatic hyperplasia)    Hyperlipidemia    Hypothyroidism    Past Surgical History:  Procedure Laterality Date   CARPAL TUNNEL RELEASE Bilateral    Family History  Problem Relation Age of Onset   Hypertension  Mother    Allergies Mother    Kidney Stones Father    Cancer Sister    Hyperthyroidism Sister    Headache Son    Kidney Stones Brother    Social History   Socioeconomic History   Marital status: Divorced    Spouse name: Not on file   Number of children: 3   Years of education: Not on file   Highest education level: Not on file  Occupational History   Occupation: Cook  Tobacco Use   Smoking status: Never   Smokeless tobacco: Never  Vaping Use   Vaping status: Never Used  Substance and Sexual Activity   Alcohol use: Not Currently   Drug use: No   Sexual activity: Yes  Other Topics Concern   Not on file  Social History Narrative   Patient lives by himself. He travels to [place] every year in August. He stays 30 days.   Social Determinants of Health   Financial Resource Strain: Low Risk  (07/16/2023)   Overall Financial Resource Strain (CARDIA)    Difficulty of Paying Living Expenses: Not hard at all  Food Insecurity: No Food Insecurity (07/16/2023)   Hunger Vital Sign    Worried About Running Out of Food in the Last Year: Never true    Ran Out of Food in the Last Year: Never true  Transportation Needs: No Transportation Needs (07/16/2023)   PRAPARE - Transportation  Lack of Transportation (Medical): No    Lack of Transportation (Non-Medical): No  Physical Activity: Sufficiently Active (07/16/2023)   Exercise Vital Sign    Days of Exercise per Week: 6 days    Minutes of Exercise per Session: 30 min  Stress: No Stress Concern Present (07/16/2023)   [name]    Feeling of Stress : Not at all  Social Connections: Socially Isolated (07/16/2023)   Social Connection and Isolation Panel [NHANES]    Frequency of Communication with Friends and Family: More than three times a week    Frequency of Social Gatherings with Friends and Family: Once a week    Attends Religious Services: Never    Database administrator  or Organizations: No    Attends Banker Meetings: Never    Marital Status: Divorced   Tobacco Counseling Not needed-he does not smoke.   Clinical Intake: BMI - recorded: 26.67 Nutritional Status: BMI 25 -29 Overweight Nutritional Risks: None Diabetes: No  How often do you need to have someone help you when you read instructions, pamphlets, or other written materials from your doctor or pharmacy?: 3 - Sometimes What is the last grade level you completed in school?: Went to school in another country  Interpreter Needed?: No   Comments: Patient reports once in a while he will come across a word that he is not familiar with in the English langauage and will need help.  Patient Care Team: [name], NP as PCP - General (Family Medicine)     Assessment:   This is a routine wellness examination for Glenn Lewis.  Hearing/Vision screen Not completed, patient is going to [place] next week and reports he will be obtaining a vision exam while there.   Dietary issues and exercise activities discussed: Exercise: GYM 5 days a week , 30 minutes Diet: Eat one meal per day-breakfast; Drinks water, juice      Goals Addressed             This Visit's Progress    Maintain Mobility and Function       Evidence-based guidance:  Emphasize the importance of physical activity and aerobic exercise as included in treatment plan; assess barriers to adherence; consider patient's abilities and preferences.  Encourage gradual increase in activity or exercise instead of stopping if pain occurs.  Reinforce individual therapy exercise prescription, such as strengthening, stabilization and stretching programs.  Promote optimal body mechanics to stabilize the spine with lifting and functional activity.  Encourage activity and mobility modifications to facilitate optimal function, such as using a log roll for bed mobility or dressing from a seated position.  Reinforce individual adaptive  equipment recommendations to limit excessive spinal movements, such as a Event organiser.  Assess adequacy of sleep; encourage use of sleep hygiene techniques, such as bedtime routine; use of white noise; dark, cool bedroom; avoiding daytime naps, heavy meals or exercise before bedtime.  Promote positions and modification to optimize sleep and sexual activity; consider pillows or positioning devices to assist in maintaining neutral spine.  Explore options for applying ergonomic principles at work and home, such as frequent position changes, using ergonomically designed equipment and working at optimal height.  Promote modifications to increase comfort with driving such as lumbar support, optimizing seat and steering wheel position, using cruise control and taking frequent rest stops to stretch and walk.   Notes:       Depression Screen    07/16/2023  7:46 AM 02/11/2023    7:36 AM 07/10/2022    1:28 PM 12/19/2021    8:05 AM 07/04/2020    1:35 PM 02/14/2020    4:23 PM 10/06/2019    1:14 PM  PHQ 2/9 Scores  PHQ - 2 Score 0 1 0 0 0 0 0  PHQ- 9 Score 3 6 9 2        Fall Risk    07/16/2023    7:45 AM 02/11/2023    7:36 AM 07/10/2022    1:28 PM 12/19/2021    8:05 AM 07/04/2020    1:35 PM  Fall Risk   Falls in the past year? 0 0 0 0 0  Number falls in past yr: 0 0 0  0  Injury with Fall? 0 0 0  0  Risk for fall due to : No Fall Risks No Fall Risks No Fall Risks No Fall Risks   Follow up Falls evaluation completed Falls evaluation completed Falls evaluation completed Falls evaluation completed Falls evaluation completed    Office Visit from 07/16/2023 in [facility]   07/22/2023   1231  Functional Status Survey   Is the patient deaf or have difficulty hearing? No  Does the patient have difficulty seeing, even when wearing glasses/contacts? No  Does the patient have difficulty concentrating, remembering, or making decisions? No  Does the  patient have difficulty walking or climbing stairs? No  Does the patient have difficulty dressing or bathing? No  Does the patient have difficulty doing errands alone such as visiting a doctor's office or shopping? No  Medicare Wellness Functional Status (additional questions) In your present state of health, do you have any difficulty performing the following activities:   Preparing Food and eating ? No  Using the Toilet? No  In the past six months, have you accidently leaked urine? No  Do you have problems with loss of bowel control? No  Managing your Medications? No  Managing your Finances? No  Housekeeping or managing your Housekeeping? No   MEDICARE RISK AT HOME: None    TIMED UP AND GO:  Was the test performed? No    Cognitive Function:    07/16/2023    8:45 AM  MMSE - Mini Mental State Exam  Orientation to time 5  Orientation to Place 4  Registration 3  Attention/ Calculation 5  Recall 2  Language- name 2 objects 2  Language- repeat 1  Language- follow 3 step command 3  Language- read & follow direction 1  Write a sentence 1  Copy design 1  Total score 28   Immunizations Immunization History  Administered Date(s) Administered   Influenza, High Dose Seasonal PF 10/23/2017   PFIZER(Purple Top)SARS-COV-2 Vaccination 03/17/2020, 04/08/2020, 12/26/2020   PNEUMOCOCCAL CONJUGATE-20 02/11/2023   Tdap 02/04/2020   Zoster Recombinant(Shingrix) 02/11/2023, 05/14/2023    TDAP status: Up to date  Flu Vaccine status: Due, Education has been provided regarding the importance of this vaccine. Advised may receive this vaccine at local pharmacy or Health Dept. Aware to provide a copy of the vaccination record if obtained from local pharmacy or Health Dept. Verbalized acceptance and understanding.  Pneumococcal vaccine status: Up to date  Covid-19 vaccine status: Declined, Education has been provided regarding the importance of this vaccine but patient still declined. Advised  may receive this vaccine at local pharmacy or Health Dept.or vaccine clinic. Aware to provide a copy of the vaccination record if obtained from local pharmacy or Health  Dept. Verbalized acceptance and understanding.  Qualifies for Shingles Vaccine? Yes   Zostavax completed Yes   Shingrix Completed?: Yes  Screening Tests Health Maintenance  Topic Date Due   Colonoscopy  08/22/2018   COVID-19 Vaccine (4 - 2023-24 season) 08/23/2022   INFLUENZA VACCINE  07/24/2023   Medicare Annual Wellness (AWV)  07/15/2024   DTaP/Tdap/Td (2 - Td or Tdap) 02/03/2030   Pneumonia Vaccine 48+ Years old  Completed   Hepatitis C Screening  Completed   Zoster Vaccines- Shingrix  Completed   HPV VACCINES  Aged Out    Health Maintenance  Health Maintenance Due  Topic Date Due   Colonoscopy  08/22/2018   COVID-19 Vaccine (4 - 2023-24 season) 08/23/2022    Colorectal cancer screening: Type of screening: Colonoscopy. Completed 08/22/2016. Repeat every 2 years  Lung Cancer Screening: (Low Dose CT Chest recommended if Age 27-80 years, 20 pack-year currently smoking OR have quit w/in 15years.) does not qualify.   Lung Cancer Screening Referral: Not needed  Additional Screening:  Hepatitis C Screening: does qualify; Completed Yes   Vision Screening: Recommended annual ophthalmology exams for early detection of glaucoma and other disorders of the eye. Is the patient up to date with their annual eye exam?  No  Who is the provider or what is the name of the office in which the patient attends annual eye exams? Having any exam in [place]  If pt is not established with a provider, would they like to be referred to a provider to establish care? No .   Dental Screening: Recommended annual dental exams for proper oral hygiene  EKG completed-sinus brady with a rate of 59, no ST elevation.    Community Resource Referral / Chronic Care Management: CRR required this visit?  No   CCM required this visit?  No    I have personally reviewed and noted the following in the patient's chart:   Medical and social history Use of alcohol, tobacco or illicit drugs  Current medications and supplements including opioid prescriptions. Patient is not currently taking opioid prescriptions. Functional ability and status Nutritional status Physical activity Advanced directives List of other physicians Hospitalizations, surgeries, and ER visits in previous 12 months Vitals Screenings to include cognitive, depression, and falls Referrals and appointments  In addition, I have reviewed and discussed with patient certain preventive protocols, quality metrics, and best practice recommendations. A written personalized care plan for preventive services as well as general preventive health recommendations were provided to patient.    Zandra Abts, NP   07/22/2023   After Visit Summary: Printed  Recommend patient follow up for his reported headaches and dizziness as soon as possible.

## 2023-07-16 NOTE — Patient Instructions (Signed)
-  Annual Wellness Exam completed today.  -Follow up as scheduled next Wednesday for headache and dizziness.

## 2023-07-21 DIAGNOSIS — Z860100 Personal history of colon polyps, unspecified: Secondary | ICD-10-CM | POA: Insufficient documentation

## 2023-07-23 ENCOUNTER — Encounter (HOSPITAL_BASED_OUTPATIENT_CLINIC_OR_DEPARTMENT_OTHER): Payer: Self-pay

## 2023-07-23 ENCOUNTER — Ambulatory Visit: Payer: Medicare HMO | Admitting: Family Medicine

## 2023-07-23 ENCOUNTER — Telehealth: Payer: Self-pay

## 2023-07-23 ENCOUNTER — Encounter: Payer: Self-pay | Admitting: Family Medicine

## 2023-07-23 ENCOUNTER — Ambulatory Visit (HOSPITAL_BASED_OUTPATIENT_CLINIC_OR_DEPARTMENT_OTHER)
Admission: RE | Admit: 2023-07-23 | Discharge: 2023-07-23 | Disposition: A | Discharge status: Home / Self Care | Payer: Medicare HMO | Source: Ambulatory Visit | Attending: Family Medicine | Admitting: Family Medicine

## 2023-07-23 ENCOUNTER — Other Ambulatory Visit: Payer: Self-pay

## 2023-07-23 VITALS — BP 112/54 | HR 76 | Temp 98.2°F | Ht 65.0 in | Wt 161.0 lb

## 2023-07-23 DIAGNOSIS — R42 Dizziness and giddiness: Secondary | ICD-10-CM | POA: Insufficient documentation

## 2023-07-23 DIAGNOSIS — H538 Other visual disturbances: Secondary | ICD-10-CM | POA: Insufficient documentation

## 2023-07-23 DIAGNOSIS — H6123 Impacted cerumen, bilateral: Secondary | ICD-10-CM

## 2023-07-23 DIAGNOSIS — R519 Headache, unspecified: Secondary | ICD-10-CM

## 2023-07-23 DIAGNOSIS — E039 Hypothyroidism, unspecified: Secondary | ICD-10-CM

## 2023-07-23 LAB — CBC WITH DIFFERENTIAL/PLATELET
Basophils Absolute: 0 10*3/uL (ref 0.0–0.1)
Basophils Relative: 0.2 % (ref 0.0–3.0)
Eosinophils Absolute: 0.3 10*3/uL (ref 0.0–0.7)
Eosinophils Relative: 4 % (ref 0.0–5.0)
HCT: 38.7 % — ABNORMAL LOW (ref 39.0–52.0)
Hemoglobin: 12.5 g/dL — ABNORMAL LOW (ref 13.0–17.0)
Lymphocytes Relative: 25.3 % (ref 12.0–46.0)
Lymphs Abs: 1.7 10*3/uL (ref 0.7–4.0)
MCHC: 32.3 g/dL (ref 30.0–36.0)
MCV: 100 fl (ref 78.0–100.0)
Monocytes Absolute: 0.7 10*3/uL (ref 0.1–1.0)
Monocytes Relative: 10.9 % (ref 3.0–12.0)
Neutro Abs: 3.9 10*3/uL (ref 1.4–7.7)
Neutrophils Relative %: 59.6 % (ref 43.0–77.0)
Platelets: 204 10*3/uL (ref 150.0–400.0)
RBC: 3.87 Mil/uL — ABNORMAL LOW (ref 4.22–5.81)
RDW: 14.1 % (ref 11.5–15.5)
WBC: 6.6 10*3/uL (ref 4.0–10.5)

## 2023-07-23 LAB — SEDIMENTATION RATE: Sed Rate: 53 mm/hr — ABNORMAL HIGH (ref 0–20)

## 2023-07-23 LAB — COMPREHENSIVE METABOLIC PANEL
ALT: 10 U/L (ref 0–53)
AST: 9 U/L (ref 0–37)
Albumin: 3.3 g/dL — ABNORMAL LOW (ref 3.5–5.2)
Alkaline Phosphatase: 34 U/L — ABNORMAL LOW (ref 39–117)
BUN: 21 mg/dL (ref 6–23)
CO2: 29 mEq/L (ref 19–32)
Calcium: 9.2 mg/dL (ref 8.4–10.5)
Chloride: 104 mEq/L (ref 96–112)
Creatinine, Ser: 0.98 mg/dL (ref 0.40–1.50)
GFR: 76.85 mL/min (ref >60.00)
Glucose, Bld: 95 mg/dL (ref 70–99)
Potassium: 5.3 mEq/L — ABNORMAL HIGH (ref 3.5–5.1)
Sodium: 140 mEq/L (ref 135–145)
Total Bilirubin: 0.2 mg/dL (ref 0.2–1.2)
Total Protein: 9 g/dL — ABNORMAL HIGH (ref 6.0–8.3)

## 2023-07-23 LAB — MAGNESIUM: Magnesium: 2.1 mg/dL (ref 1.5–2.5)

## 2023-07-23 LAB — TSH: TSH: 2.11 u[IU]/mL (ref 0.35–5.50)

## 2023-07-23 MED ORDER — PREDNISONE 10 MG PO TABS
ORAL_TABLET | ORAL | 0 refills | Status: AC
Start: 2023-07-23 — End: 2023-07-29

## 2023-07-23 NOTE — Telephone Encounter (Signed)
Left pt a Vm to return our call in regards to lab results . Lab is unable to add on the iron panel as it is outside the time frame .

## 2023-07-23 NOTE — Progress Notes (Addendum)
Established Patient Office Visit   Subjective:  Patient ID: Glenn Lewis, male    DOB: 11/06/1951  Age: 72 y.o. MRN: [id number]  Chief Complaint  Patient presents with   Headache    Pt is here with C/O of headache on left side, effecting vision. Pt reports when he becomes hot these are intense, lasting long periods. SX started 1 month or more. No otc.    HPI: Patient is complaining of a left sided headache. He reports the headaches started over a month ago. He reports he has daily headaches on the left side. He denies waking up with a headache. He reports the headaches mostly occur when he becomes hot and in the afternoon. He reports it occurs gradually. Described as pressure and sometimes has dizziness and has blurry vision in both eyes. He reports the headache last 3-4 hours. He reports he takes Aleve or Ibuprofen with no relief. He reports he places his head underneath running cold water and it helps.   He reports he has a constant tinnitus in left ear. He reports he has been seen by a specialist and told there was nothing that could be done.   Denies having a history of headaches.  Denies headache in office.   He reports he drinks about 2 liters of fluid a day.   Patient is leaving tomorrow for [place] for a month. He reports he returns on September 4th. He reports while in [place] he will have his eyes examined.   Review of Systems  Neurological:  Positive for headaches.   See HPI above     Objective:   BP (!) 112/54   Pulse 76   Temp 98.2 F (36.8 C)   Ht 5\' 5"  (1.651 m)   Wt 161 lb (73 kg)   SpO2 97%   BMI 26.79 kg/m    Physical Exam Vitals reviewed.  Constitutional:      General: He is not in acute distress.    Appearance: Normal appearance. He is not ill-appearing, toxic-appearing or diaphoretic.  HENT:     Head: Normocephalic and atraumatic.     Right Ear: There is impacted cerumen.     Left Ear: There is impacted cerumen.  Eyes:     General:         Right eye: No discharge.        Left eye: No discharge.     Extraocular Movements: Extraocular movements intact.     Right eye: No nystagmus.     Left eye: No nystagmus.     Conjunctiva/sclera: Conjunctivae normal.     Pupils: Pupils are equal, round, and reactive to light.  Cardiovascular:     Rate and Rhythm: Normal rate and regular rhythm.     Heart sounds: Normal heart sounds. No murmur heard.    No friction rub. No gallop.  Pulmonary:     Effort: Pulmonary effort is normal. No respiratory distress.     Breath sounds: Normal breath sounds.  Musculoskeletal:        General: Normal range of motion.  Skin:    General: Skin is warm and dry.  Neurological:     General: No focal deficit present.     Mental Status: He is alert and oriented to person, place, and time. Mental status is at baseline.     Cranial Nerves: Cranial nerves 2-12 are intact.     Motor: No weakness.     Coordination: Coordination is intact.  Psychiatric:  Mood and Affect: Mood normal.        Behavior: Behavior normal.        Thought Content: Thought content normal.        Judgment: Judgment normal.   PRE-PROCEDURE EXAM: Left and Right TM cannot be visualized due to total occlusion/impaction of the ear canal. PROCEDURE INDICATION: Remove wax to visualize ear drum & relieve discomfort CONSENT:  Verbal PROCEDURE NOTE: Left and Right ear: The CMA, Tonya Hutchins, irrigated both ears with warm water and ear drops to remove the wax. 100% of the wax was removed.  POST- PROCEDURE EXAM: TMs successfully visualized. TM with no erythema. The patient tolerated the procedure well.     Assessment & Plan:  New onset of headaches -     Magnesium -     Comprehensive metabolic panel -     CBC with Differential/Platelet -     Sedimentation rate -     TSH -     CT HEAD WO CONTRAST ( ); Future  Blurry vision, bilateral -     CT HEAD WO CONTRAST ( ); Future  Dizziness -     CT HEAD WO CONTRAST ( );  Future  Hypothyroidism, unspecified type -     TSH  Hearing loss of both ears due to cerumen impaction -     Ear Lavage  1.Ordered screening labs for new onset of headaches for a reason. Patient has a history of hypothyroidism and takes Levothyroxine. Last TSH was normal back in Feb 2024. Since headaches started a month ago, will recheck TSH. 2.Ordered STAT CT head scan without contrast for new onset of headaches, blurry vision, and dizziness. Concerned since patient is leaving out of country for a month with having these new onset headaches. Advised patient to go directly to [facility] for scan. Will notify patient once results are available.  3.Bilateral ear lavage completed on both ears. Successful. Patient reports his ears feel a little better.  4.Patient has a chronic management appointment scheduled on 09/10/2023.   Zandra Abts, NP

## 2023-07-23 NOTE — Telephone Encounter (Signed)
Pt called back. °

## 2023-07-23 NOTE — Telephone Encounter (Signed)
Pt called back-- please return call.  

## 2023-07-23 NOTE — Telephone Encounter (Signed)
Sent in rx for predniSONE (DELTASONE) 10 MG

## 2023-07-23 NOTE — Telephone Encounter (Signed)
-----   Message from Zandra Abts sent at 07/23/2023 10:54 AM EDT ----- Your CT head scan shows a normal appearance of your brain with no cause of the headache from a brain stand point. You do have some mild swelling at your sinus lining, especially at the sinus area near your nose. Recommend to take Prednisone 10mg , 6 day taper to see if this helps with the swelling and headache. You would take 6 tablets the first day, 5 tablets the second day, 4 tablets the third day, 3 tablets the fourth day, 2 tablets the fifth day, and 1 tablet the sixth day. You may take all these tablets together for that day or taper them through the day. Recommend to take these during the day, not in the evening. This medication can cause you to stay up. Usually recommend to not take tablets after 3pm. If you are still having the pain and symptoms when you return from [place], follow up.

## 2023-07-23 NOTE — Telephone Encounter (Signed)
-----   Message from Zandra Abts sent at 07/23/2023  2:59 PM EDT ----- Labs are stable for no reason found for headaches. I do recommend [place] recheck your comprehensive metabolic panel (Lab: CMP; Dx: low potassium) when you return to make sure the potassium is back down. Your hemoglobin and hematocrit are slightly low, recommend to add an iron panel to this lab. (Lab: Iron panel; dx: low hemoglobin) If this lab is not able to be added, we can schedule a lab visit when you return from your trip to check your iron level. Sed rate is elevated, this is a generic test for inflammation throughout the body. It is not high enough to have concerns. All other labs are stable.

## 2023-07-23 NOTE — Patient Instructions (Signed)
-  Ear lavage completed in both ears for impaction. -Ordered labs for reason of new onset of headache and hypothyroidism. Office will call with lab results and you may see them on MyChart. -Please go directly to [facility] to have your CT head scan for new onset headaches. Office will call with results when available today.

## 2023-07-23 NOTE — Addendum Note (Signed)
Addended by: [name] on: 07/23/2023 08:45 AM   Modules accepted: Orders

## 2023-07-24 NOTE — Telephone Encounter (Signed)
Pt is out of town now.

## 2023-07-24 NOTE — Telephone Encounter (Signed)
Pt has taken a trip out of country. Mardene Celeste will go over labs when pt returns.

## 2023-08-07 ENCOUNTER — Encounter (INDEPENDENT_AMBULATORY_CARE_PROVIDER_SITE_OTHER): Payer: Self-pay

## 2023-08-09 ENCOUNTER — Other Ambulatory Visit: Payer: Self-pay | Admitting: Family Medicine

## 2023-08-09 DIAGNOSIS — Z1329 Encounter for screening for other suspected endocrine disorder: Secondary | ICD-10-CM

## 2023-08-09 DIAGNOSIS — E782 Mixed hyperlipidemia: Secondary | ICD-10-CM

## 2023-08-27 ENCOUNTER — Ambulatory Visit: Payer: Medicare HMO | Admitting: Family Medicine

## 2023-09-10 ENCOUNTER — Encounter: Payer: Medicare HMO | Admitting: Family Medicine

## 2023-09-17 ENCOUNTER — Encounter: Payer: Self-pay | Admitting: Family Medicine

## 2023-09-17 ENCOUNTER — Ambulatory Visit (INDEPENDENT_AMBULATORY_CARE_PROVIDER_SITE_OTHER): Payer: Medicare HMO | Admitting: Family Medicine

## 2023-09-17 VITALS — BP 122/72 | HR 54 | Temp 98.8°F | Ht 65.5 in | Wt 160.0 lb

## 2023-09-17 DIAGNOSIS — E876 Hypokalemia: Secondary | ICD-10-CM | POA: Diagnosis not present

## 2023-09-17 DIAGNOSIS — Z1211 Encounter for screening for malignant neoplasm of colon: Secondary | ICD-10-CM

## 2023-09-17 DIAGNOSIS — Z0000 Encounter for general adult medical examination without abnormal findings: Secondary | ICD-10-CM | POA: Diagnosis not present

## 2023-09-17 DIAGNOSIS — D649 Anemia, unspecified: Secondary | ICD-10-CM

## 2023-09-17 NOTE — Progress Notes (Signed)
Complete physical exam  Patient: Glenn Lewis   DOB: 02/07/1951   72 y.o. Male  MRN: [id number]  Subjective:    Chief Complaint  Patient presents with   Annual Exam    Pt is here today for Annual Exam. Pt reports no concerns today. Pt reports he changed medication , since visiting his country.    Glenn Lewis is a 72 y.o. male who presents today for a complete physical exam. He reports consuming a general diet. Gym/ health club routine includes cardio. He report that he also lifts weights. He generally feels well. He reports sleeping fairly well. He reports he does wake up 2 times a night to use the bathroom but is able to fall back asleep. He does not have additional problems to discuss today.    Most recent fall risk assessment:    09/17/2023    1:57 PM  Fall Risk   Falls in the past year? 0  Number falls in past yr: 0  Injury with Fall? 0  Risk for fall due to : No Fall Risks  Follow up Falls evaluation completed     Most recent depression screenings:    07/16/2023    7:46 AM 02/11/2023    7:36 AM  PHQ 2/9 Scores  PHQ - 2 Score 0 1  PHQ- 9 Score 3 6    Vision:Within last year and Dental: No current dental problems and Receives regular dental care  Patient Active Problem List   Diagnosis Date Noted   Personal history of colonic polyps 07/21/2023   Mixed hyperlipidemia 02/11/2023   Hypothyroidism 02/11/2023   Deviated nasal septum 02/09/2020   Closed fracture of nasal bones 02/09/2020   Diverticulosis of colon without hemorrhage 09/12/2017   Adrenal mass (HCC) 09/12/2017   Past Medical History:  Diagnosis Date   BPH (benign prostatic hyperplasia)    Hyperlipidemia    Hypothyroidism    Past Surgical History:  Procedure Laterality Date   CARPAL TUNNEL RELEASE Bilateral    Social History   Tobacco Use   Smoking status: Never   Smokeless tobacco: Never  Vaping Use   Vaping status: Never Used  Substance Use Topics   Alcohol use: Not Currently   Drug  use: No   Social History   Socioeconomic History   Marital status: Divorced    Spouse name: Not on file   Number of children: 3   Years of education: Not on file   Highest education level: Not on file  Occupational History   Occupation: Cook  Tobacco Use   Smoking status: Never   Smokeless tobacco: Never  Vaping Use   Vaping status: Never Used  Substance and Sexual Activity   Alcohol use: Not Currently   Drug use: No   Sexual activity: Yes  Other Topics Concern   Not on file  Social History Narrative   Patient lives by himself. He travels to [place] every year in August. He stays 30 days.   Social Determinants of Health   Financial Resource Strain: Low Risk  (07/16/2023)   Overall Financial Resource Strain (CARDIA)    Difficulty of Paying Living Expenses: Not hard at all  Food Insecurity: No Food Insecurity (07/16/2023)   Hunger Vital Sign    Worried About Running Out of Food in the Last Year: Never true    Ran Out of Food in the Last Year: Never true  Transportation Needs: No Transportation Needs (07/16/2023)   PRAPARE - Transportation  Lack of Transportation (Medical): No    Lack of Transportation (Non-Medical): No  Physical Activity: Sufficiently Active (07/16/2023)   Exercise Vital Sign    Days of Exercise per Week: 6 days    Minutes of Exercise per Session: 30 min  Stress: No Stress Concern Present (07/16/2023)   [name]    Feeling of Stress : Not at all  Social Connections: Socially Isolated (07/16/2023)   Social Connection and Isolation Panel [NHANES]    Frequency of Communication with Friends and Family: More than three times a week    Frequency of Social Gatherings with Friends and Family: Once a week    Attends Religious Services: Never    Database administrator or Organizations: No    Attends Banker Meetings: Never    Marital Status: Divorced  Catering manager Violence: Not  At Risk (07/16/2023)   Humiliation, Afraid, Rape, and Kick questionnaire    Fear of Current or Ex-Partner: No    Emotionally Abused: No    Physically Abused: No    Sexually Abused: No   Family Status  Relation Name Status   Mother  Deceased   Father  Deceased   Sister  Deceased   Sister  Alive   Sister  Alive   Sister  Alive   Daughter  Alive   Son  Alive   Brother  Alive  No partnership data on file   Family History  Problem Relation Age of Onset   Hypertension Mother    Allergies Mother    Kidney Stones Father    Cancer Sister    Hyperthyroidism Sister    Headache Son    Kidney Stones Brother    No Known Allergies   Patient Care Team: [name], NP as PCP - General (Family Medicine)   Outpatient Medications Prior to Visit  Medication Sig   Ascorbic Acid (VITAMIN C) 1000 MG tablet Take 1,000 mg by mouth daily.   Iron-Vit C-Vit B12-Folic Acid (IRON 100 PLUS) 161-096-0.454-0 MG TABS Take by mouth.   Levothyroxine Sodium 62.5 MCG CAPS Take by mouth.   Magnesium 200 MG TABS Take 1 tablet by mouth daily.   Menaquinone-7 (VITAMIN K2 PO) Take 1 tablet by mouth daily.   metFORMIN (GLUCOPHAGE-XR) 500 MG 24 hr tablet Take 500 mg by mouth daily with breakfast.   rosuvastatin (CRESTOR) 10 MG tablet TAKE 1 TABLET BY MOUTH EVERY DAY   VITAMIN D PO Take 1 tablet by mouth daily.   levothyroxine (SYNTHROID) 50 MCG tablet TAKE 1 TABLET BY MOUTH EVERY DAY (Patient not taking: Reported on 09/17/2023)   No facility-administered medications prior to visit.   Review of Systems  Constitutional: Negative.   HENT: Negative.    Eyes: Negative.   Respiratory: Negative.    Cardiovascular: Negative.   Gastrointestinal: Negative.   Genitourinary: Negative.   Musculoskeletal: Negative.   Skin: Negative.   Neurological: Negative.   Psychiatric/Behavioral: Negative.        Objective:   BP 122/72   Pulse (!) 54   Temp 98.8 F (37.1 C)   Ht 5' 5.5" (1.664 m)   Wt 160 lb  (72.6 kg)   SpO2 97%   BMI 26.22 kg/m   Physical Exam Vitals reviewed.  Constitutional:      General: He is not in acute distress.    Appearance: He is not ill-appearing or toxic-appearing.  HENT:     Head: Normocephalic and atraumatic.  Right Ear: Tympanic membrane, ear canal and external ear normal.     Left Ear: Tympanic membrane, ear canal and external ear normal.     Nose: Nose normal.     Mouth/Throat:     Mouth: Mucous membranes are moist.     Pharynx: Oropharynx is clear.  Eyes:     Conjunctiva/sclera: Conjunctivae normal.     Comments: Wears glasses  Cardiovascular:     Rate and Rhythm: Normal rate and regular rhythm.     Heart sounds: Normal heart sounds. No murmur heard.    No friction rub. No gallop.  Pulmonary:     Effort: Pulmonary effort is normal. No respiratory distress.     Breath sounds: Normal breath sounds. No wheezing.  Abdominal:     General: Abdomen is flat. Bowel sounds are normal. There is no distension.     Palpations: Abdomen is soft.     Tenderness: There is no abdominal tenderness. There is no right CVA tenderness or left CVA tenderness.  Musculoskeletal:     Cervical back: Neck supple.  Skin:    General: Skin is warm and dry.  Neurological:     General: No focal deficit present.     Mental Status: He is alert and oriented to person, place, and time. Mental status is at baseline.  Psychiatric:        Mood and Affect: Mood normal.        Behavior: Behavior normal.        Thought Content: Thought content normal.        Judgment: Judgment normal.        Assessment & Plan:    Routine Health Maintenance and Physical Exam  Immunization History  Administered Date(s) Administered   Influenza, High Dose Seasonal PF 10/23/2017   PFIZER(Purple Top)SARS-COV-2 Vaccination 03/17/2020, 04/08/2020, 12/26/2020   PNEUMOCOCCAL CONJUGATE-20 02/11/2023   Tdap 02/04/2020   Zoster Recombinant(Shingrix) 02/11/2023, 05/14/2023    Health Maintenance   Topic Date Due   Colonoscopy  08/22/2018   INFLUENZA VACCINE  07/24/2023   COVID-19 Vaccine (4 - 2023-24 season) 08/24/2023   Medicare Annual Wellness (AWV)  07/15/2024   DTaP/Tdap/Td (2 - Td or Tdap) 02/03/2030   Pneumonia Vaccine 68+ Years old  Completed   Hepatitis C Screening  Completed   Zoster Vaccines- Shingrix  Completed   HPV VACCINES  Aged Out    Discussed health benefits of physical activity, and encouraged him to engage in regular exercise appropriate for his age and condition.  Annual physical exam  Screening for colon cancer -     Ambulatory referral to Gastroenterology  Low hemoglobin and low hematocrit -     CBC With Differential  Low blood potassium -     Comprehensive metabolic panel   1.Reviewed Health Maintenance -Colonoscopy: Referral placed to GI -Influenza Vaccine: Declined at this time. Advised he may obtain your influenza vaccine at this office or at his local pharmacy -Covid Vaccine/booster: Declined. Advised he may obtain at his local pharmacy if he wishes 2. CBC with diff and CMP obtained today due to low hemoglobin, low hematocrit, and low potassium at his labs on 07/23/2023.  3. Recommend he continues with a well balanced diet and regular exercise 4.Continue all prescribed medications.  Return in about 6 months (around 03/16/2024) for Chronic Visit : [place]. I personally was present during the history, physical exam, and medical decision-making activities of this service and have verified that the service and findings are accurately documented in the  nurse practitioner student's note.  Zandra Abts, NP    Zandra Abts, NP

## 2023-09-17 NOTE — Patient Instructions (Addendum)
-  Annual Physical Complete, no concerns -CBC with diff and CMP ordered due to low hemoglobin, hematocrit and potassium.  Office will call with results and you will see them on MyChart -Referral placed to GI for colon cancer screening -Continue all prescribed medications -Continue with well balanced diet and regular exercise -You may obtain your influenza vaccine at this office or at your local pharmacy -You may obtain your Covid booster at your local pharmacy -Follow up in 6 months

## 2023-09-18 ENCOUNTER — Other Ambulatory Visit: Payer: Self-pay

## 2023-09-18 ENCOUNTER — Telehealth: Payer: Self-pay

## 2023-09-18 ENCOUNTER — Ambulatory Visit: Payer: Medicare HMO

## 2023-09-18 DIAGNOSIS — D649 Anemia, unspecified: Secondary | ICD-10-CM | POA: Diagnosis not present

## 2023-09-18 LAB — COMPREHENSIVE METABOLIC PANEL
ALT: 16 U/L (ref 0–53)
AST: 19 U/L (ref 0–37)
Albumin: 3.4 g/dL — ABNORMAL LOW (ref 3.5–5.2)
Alkaline Phosphatase: 31 U/L — ABNORMAL LOW (ref 39–117)
BUN: 13 mg/dL (ref 6–23)
CO2: 29 mEq/L (ref 19–32)
Calcium: 9.2 mg/dL (ref 8.4–10.5)
Chloride: 98 mEq/L (ref 96–112)
Creatinine, Ser: 0.97 mg/dL (ref 0.40–1.50)
GFR: 77.71 mL/min (ref >60.00)
Glucose, Bld: 82 mg/dL (ref 70–99)
Potassium: 4 mEq/L (ref 3.5–5.1)
Sodium: 134 mEq/L — ABNORMAL LOW (ref 135–145)
Total Bilirubin: 0.4 mg/dL (ref 0.2–1.2)
Total Protein: 9.6 g/dL — ABNORMAL HIGH (ref 6.0–8.3)

## 2023-09-18 LAB — CBC WITH DIFFERENTIAL/PLATELET
Basophils Absolute: 0 10*3/uL (ref 0.0–0.2)
Basos: 0 %
EOS (ABSOLUTE): 0.2 10*3/uL (ref 0.0–0.4)
Eos: 3 %
Hematocrit: 39 % (ref 37.5–51.0)
Hemoglobin: 12.7 g/dL — ABNORMAL LOW (ref 13.0–17.7)
Immature Grans (Abs): 0 10*3/uL (ref 0.0–0.1)
Immature Granulocytes: 0 %
Lymphocytes Absolute: 2.6 10*3/uL (ref 0.7–3.1)
Lymphs: 41 %
MCH: 32.2 pg (ref 26.6–33.0)
MCHC: 32.6 g/dL (ref 31.5–35.7)
MCV: 99 fL — ABNORMAL HIGH (ref 79–97)
Monocytes Absolute: 0.7 10*3/uL (ref 0.1–0.9)
Monocytes: 10 %
Neutrophils Absolute: 2.9 10*3/uL (ref 1.4–7.0)
Neutrophils: 46 %
Platelets: 209 10*3/uL (ref 150–450)
RBC: 3.94 x10E6/uL — ABNORMAL LOW (ref 4.14–5.80)
RDW: 12.2 % (ref 11.6–15.4)
WBC: 6.3 10*3/uL (ref 3.4–10.8)

## 2023-09-18 LAB — IRON: Iron: 109 ug/dL (ref 42–165)

## 2023-09-18 NOTE — Telephone Encounter (Signed)
-----   Message from Zandra Abts sent at 09/18/2023  8:19 AM EDT ----- Your hemoglobin has improved some, but still slightly low. I would like to add on an iron panel to look more closely at your iron levels.(Lab: Iron panel; Dx: Anemia)  As soon as we get this back, you will see results on MyChart and will give you a call.

## 2023-09-19 ENCOUNTER — Telehealth: Payer: Self-pay

## 2023-09-19 ENCOUNTER — Ambulatory Visit: Payer: Medicare HMO

## 2023-09-19 ENCOUNTER — Other Ambulatory Visit (INDEPENDENT_AMBULATORY_CARE_PROVIDER_SITE_OTHER): Payer: Medicare HMO

## 2023-09-19 ENCOUNTER — Other Ambulatory Visit: Payer: Self-pay

## 2023-09-19 DIAGNOSIS — D649 Anemia, unspecified: Secondary | ICD-10-CM

## 2023-09-19 LAB — FERRITIN: Ferritin: 171.7 ng/mL (ref 22.0–322.0)

## 2023-09-19 NOTE — Telephone Encounter (Signed)
Pt has been notified.

## 2023-09-19 NOTE — Telephone Encounter (Signed)
-----   Message from Zandra Abts sent at 09/19/2023 12:24 PM EDT ----- Iron storage is normal.

## 2023-09-19 NOTE — Telephone Encounter (Signed)
-----   Message from Glenn Lewis sent at 09/19/2023  8:55 AM EDT ----- Are we able to get the iron panel with ferritin level? I would like more than just the iron. Ty. Iron looks stable.

## 2023-09-19 NOTE — Telephone Encounter (Signed)
Pt reports he eats 4 eggs everyday. Pt reports he works at [employer].

## 2023-09-19 NOTE — Telephone Encounter (Signed)
-----   Message from Zandra Abts sent at 09/19/2023  9:07 AM EDT ----- Alkaline Phosphatase is mildly low, recommend to eat a well balanced diet. Your protein is elevated, do you eat a high protein diet? All other labs are stable.

## 2023-09-24 ENCOUNTER — Other Ambulatory Visit: Payer: Self-pay

## 2023-09-24 ENCOUNTER — Ambulatory Visit: Payer: Medicare HMO

## 2023-09-24 DIAGNOSIS — E611 Iron deficiency: Secondary | ICD-10-CM

## 2023-09-25 ENCOUNTER — Telehealth: Payer: Self-pay

## 2023-09-25 LAB — IRON,TIBC AND FERRITIN PANEL
%SAT: 32 % (ref 20–48)
Ferritin: 160 ng/mL (ref 24–380)
Iron: 99 ug/dL (ref 50–180)
TIBC: 307 ug/dL (ref 250–425)

## 2023-09-25 NOTE — Telephone Encounter (Signed)
-----   Message from Zandra Abts sent at 09/25/2023 12:56 PM EDT ----- Your Iron panel looks stable.

## 2023-10-14 ENCOUNTER — Other Ambulatory Visit: Payer: Self-pay

## 2023-10-14 DIAGNOSIS — E039 Hypothyroidism, unspecified: Secondary | ICD-10-CM

## 2023-10-14 MED ORDER — LEVOTHYROXINE SODIUM 50 MCG PO TABS: 50.0000 ug | ORAL_TABLET | Freq: Every day | ORAL | 0 refills | Status: AC

## 2023-10-14 NOTE — Telephone Encounter (Signed)
A user error has taken place: medication ordered in error, not dispensed to this patient.

## 2024-01-06 ENCOUNTER — Encounter: Payer: Self-pay | Admitting: Family Medicine

## 2024-02-03 ENCOUNTER — Ambulatory Visit: Payer: Self-pay | Admitting: Family Medicine

## 2024-02-03 NOTE — Telephone Encounter (Addendum)
Copied from CRM [id number]. Topic: Clinical - Red Word Triage >> Feb 03, 2024 11:09 AM Truddie Crumble wrote: Red Word that prompted transfer to Nurse Triage: runny nose and tight chest   Chief Complaint: chest "tightness" Symptoms: runny nose, 6/10 chest "tightness" and congestion, coughing up "greenish brownish color" with prior coughing up blood in December, interrupted sleep Frequency: continual Pertinent Negatives: Patient son denies SOB Disposition: [] 911 / [] ED /[] Urgent Care (no appt availability in office) / [] Appointment(In office/virtual)/ []  [facility]/ [] Home Care/ [x] Refused Recommended Disposition /[] [facility]/ []  Follow-up with PCP Additional Notes: Pt and son are poor historians. Pt's son translating for pt, pt in background, son on phone with nurse. Pt son reporting that pt is feeling "tightness" "right in middle of chest," confirms "just in chest" not in arms/neck/jaw/back. Pt son reporting pt "been sick for while now" since "around Christmas" with cold type symptoms, "tightness in chest since then," confirms it "come and go" but "worse at night when tries to go to sleep, having hard time sleeping, just with deep breaths and coughing," but also lasts "all day long ma'am." Pt reporting to son that pain is 6/10 in chest. Nurse asking further questions about chest pain. Pt son stating "ma'am this is not like heart attack," stating "what I mean by tightness in lungs, I mean congested in lungs." Pt son confirms no SOB, confirms able to lie flat to sleep. Pt son confirms coughing up phlegm, reporting that "in beginning had little bit blood in it, greenish brownish color now," confirms no blood now, blood in December. Nurse looking for clarification on chest pain and SOB, pt son reporting "little bit" pain in chest when "breathes in, with deep breath," son did not answer question of chest pain worsening or same. Pt son confirms no heartburn. Advised pt be examined in hospital  for symptoms, pt son stating "he's not going to the ER ma'am, he's looking to see his primary care doctor," reporting he "went to doc in December" and they prescribed med but didn't go away. Advised that HP message would be sent to office to confirm next best steps, pt would receive call back. Pt son verbalized understanding. Nurse spoke with CAL, aware of pt refusal and on look-out for HP message.  Reason for Disposition  Taking a deep breath makes pain worse  Additional Information  Negative: [1] Chest pain lasts > 5 minutes AND [2] age > 76    Pt son unclear, "lasts all day long" but also "come and go" and ""what I mean by tightness in lungs, I mean congested in lungs," "little bit" pain in chest when "breathes in, with deep breath" and pt reporting pain 6/10 in chest  Answer Assessment - Initial Assessment Questions 1. LOCATION: "Where does it hurt?"       Right in middle of chest 2. RADIATION: "Does the pain go anywhere else?" (e.g., into neck, jaw, arms, back)     Just in chest 3. ONSET: "When did the chest pain begin?" (Minutes, hours or days)      Been sick for while now around Christmas with cold type symptoms, tightness in chest since then, come and go 4. PATTERN: "Does the pain come and go, or has it been constant since it started?"  "Does it get worse with exertion?"      Worse at night when tries to go to sleep, having hard time sleeping, just with deep breaths and coughing 5. DURATION: "How long does it  last" (e.g., seconds, minutes, hours)     All day long ma'am 6. SEVERITY: "How bad is the pain?"  (e.g., Scale 1-10; mild, moderate, or severe)    - MILD (1-3): doesn't interfere with normal activities     - MODERATE (4-7): interferes with normal activities or awakens from sleep    - SEVERE (8-10): excruciating pain, unable to do any normal activities       6/10 7. CARDIAC RISK FACTORS: "Do you have any history of heart problems or risk factors for heart disease?" (e.g., angina,  prior heart attack; diabetes, high blood pressure, high cholesterol, smoker, or strong family history of heart disease)     denies 8. PULMONARY RISK FACTORS: "Do you have any history of lung disease?"  (e.g., blood clots in lung, asthma, emphysema, birth control pills)     denies 9. CAUSE: "What do you think is causing the chest pain?"     Congestion in chest 10. OTHER SYMPTOMS: "Do you have any other symptoms?" (e.g., dizziness, nausea, vomiting, sweating, fever, difficulty breathing, cough)       Coughing up greenish brownish color now, had blood in December, denies other  Protocols used: Chest Pain-A-AH

## 2024-02-03 NOTE — Telephone Encounter (Signed)
Tried calling patient and could not leave a voice message due to voice mailbox is not set up. I will try calling patient again.

## 2024-02-03 NOTE — Telephone Encounter (Signed)
Tried calling patient and still no answer and can not leave a voice message.

## 2024-02-04 ENCOUNTER — Encounter: Payer: Self-pay | Admitting: Family Medicine

## 2024-02-04 ENCOUNTER — Ambulatory Visit (INDEPENDENT_AMBULATORY_CARE_PROVIDER_SITE_OTHER): Payer: Medicare HMO | Admitting: Family Medicine

## 2024-02-04 ENCOUNTER — Ambulatory Visit: Payer: Medicare (Managed Care)

## 2024-02-04 VITALS — BP 140/84 | HR 70 | Temp 98.3°F | Ht 65.0 in | Wt 166.0 lb

## 2024-02-04 DIAGNOSIS — R059 Cough, unspecified: Secondary | ICD-10-CM | POA: Diagnosis not present

## 2024-02-04 DIAGNOSIS — R6889 Other general symptoms and signs: Secondary | ICD-10-CM | POA: Diagnosis not present

## 2024-02-04 DIAGNOSIS — J069 Acute upper respiratory infection, unspecified: Secondary | ICD-10-CM | POA: Diagnosis not present

## 2024-02-04 LAB — POCT INFLUENZA A/B
Influenza A, POC: NEGATIVE
Influenza B, POC: NEGATIVE

## 2024-02-04 LAB — POC COVID19 BINAXNOW: SARS Coronavirus 2 Ag: NEGATIVE

## 2024-02-04 MED ORDER — DOXYCYCLINE HYCLATE 100 MG PO TABS: 100.0000 mg | ORAL_TABLET | Freq: Two times a day (BID) | ORAL | 0 refills | Status: AC

## 2024-02-04 MED ORDER — PREDNISONE 10 MG PO TABS: ORAL_TABLET | ORAL | 0 refills | Status: AC

## 2024-02-04 MED ORDER — ALBUTEROL SULFATE HFA 108 (90 BASE) MCG/ACT IN AERS: 2.0000 | INHALATION_SPRAY | Freq: Four times a day (QID) | RESPIRATORY_TRACT | 0 refills | Status: DC | PRN

## 2024-02-04 NOTE — Progress Notes (Signed)
Acute Office Visit   Subjective:  Patient ID: Glenn Lewis, male    DOB: 17-Feb-1951, 73 y.o.   MRN: [id number]  Chief Complaint  Patient presents with   Cough    Cough fever, body ache, fatigue     HPI:  Patient reports during Christmas, he reports he started having cough, fever,  left sided chest pain when coughing, and hemoptysis. He went to [facility] on 12/24/2023 for his symptoms. He was not test for any virus during visit. He was prescribed a Prednisone dose pack and Azithromycin (Z-pak). He reports this did help some. Symptoms didn't completely resolve. But, about two weeks ago, his symptoms became worse. He is being seen today for a productive cough, yellow thick sputum, fever, body aches, fatigue, nasal congestion, mild sore throat, and shortness of breath. Initially in December, his sputum had a little blood in it, then changed from brown to yellow. Denies headache, ear pain or drainage, or chest pain.   He tried liquid Mucinex about a week ago, but not taking it now. He reports he has been drinking a lot herbal teas. Reports his symptoms have not improved.   Review of Systems  Respiratory:  Positive for cough.    See HPI above      Objective:   BP (!) 140/84 (BP Location: Left Arm, Patient Position: Sitting)   Pulse 70   Temp 98.3 F (36.8 C) (Oral)   Ht 5\' 5"  (1.651 m)   Wt 166 lb (75.3 kg)   SpO2 97%   BMI 27.62 kg/m    Physical Exam Vitals reviewed.  Constitutional:      General: He is not in acute distress.    Appearance: Normal appearance. He is ill-appearing (Mild to moderate). He is not toxic-appearing or diaphoretic.  HENT:     Head: Normocephalic and atraumatic.     Salivary Glands: Right salivary gland is not diffusely enlarged or tender. Left salivary gland is not diffusely enlarged or tender.     Right Ear: Tympanic membrane, ear canal and external ear normal. There is no impacted cerumen.     Left Ear: Tympanic membrane, ear canal and  external ear normal. There is no impacted cerumen.     Nose:     Right Sinus: No maxillary sinus tenderness or frontal sinus tenderness.     Left Sinus: No maxillary sinus tenderness or frontal sinus tenderness.     Mouth/Throat:     Pharynx: Oropharynx is clear. Uvula midline. No pharyngeal swelling, oropharyngeal exudate, posterior oropharyngeal erythema or uvula swelling.  Eyes:     General:        Right eye: No discharge.        Left eye: No discharge.     Conjunctiva/sclera: Conjunctivae normal.  Cardiovascular:     Rate and Rhythm: Normal rate and regular rhythm.     Heart sounds: Normal heart sounds. No murmur heard.    No friction rub. No gallop.  Pulmonary:     Effort: Pulmonary effort is normal. No respiratory distress.     Comments: Decreased breath sounds in posterior lung sounds. Cough during visit.  Musculoskeletal:        General: Normal range of motion.  Skin:    General: Skin is warm and dry.  Neurological:     General: No focal deficit present.     Mental Status: He is alert and oriented to person, place, and time. Mental status is at baseline.  Psychiatric:  Mood and Affect: Mood normal.        Behavior: Behavior normal.        Thought Content: Thought content normal.        Judgment: Judgment normal.     Results for orders placed or performed in visit on 02/04/24  POCT Influenza A/B  Result Value Ref Range   Influenza A, POC Negative Negative   Influenza B, POC Negative Negative  POC COVID-19 BinaxNow  Result Value Ref Range   SARS Coronavirus 2 Ag Negative Negative       Assessment & Plan:  Cough, unspecified type -     POC COVID-19 BinaxNow -     DG Chest 2 View; Future -     Doxycycline Hyclate; Take 1 tablet (100 mg total) by mouth 2 (two) times daily for 7 days.  Dispense: 14 tablet; Refill: 0 -     predniSONE; Take 6 tablets (60 mg total) by mouth daily with breakfast for 1 day, THEN 5 tablets (50 mg total) daily with breakfast for 1  day, THEN 4 tablets (40 mg total) daily with breakfast for 1 day, THEN 3 tablets (30 mg total) daily with breakfast for 1 day, THEN 2 tablets (20 mg total) daily with breakfast for 1 day, THEN 1 tablet (10 mg total) daily with breakfast for 1 day.  Dispense: 21 tablet; Refill: 0 -     Albuterol Sulfate HFA; Inhale 2 puffs into the lungs every 6 (six) hours as needed for wheezing or shortness of breath.  Dispense: 8 g; Refill: 0  Flu-like symptoms -     POCT Influenza A/B  Upper respiratory tract infection, unspecified type -     Doxycycline Hyclate; Take 1 tablet (100 mg total) by mouth 2 (two) times daily for 7 days.  Dispense: 14 tablet; Refill: 0 -     predniSONE; Take 6 tablets (60 mg total) by mouth daily with breakfast for 1 day, THEN 5 tablets (50 mg total) daily with breakfast for 1 day, THEN 4 tablets (40 mg total) daily with breakfast for 1 day, THEN 3 tablets (30 mg total) daily with breakfast for 1 day, THEN 2 tablets (20 mg total) daily with breakfast for 1 day, THEN 1 tablet (10 mg total) daily with breakfast for 1 day.  Dispense: 21 tablet; Refill: 0 -     Albuterol Sulfate HFA; Inhale 2 puffs into the lungs every 6 (six) hours as needed for wheezing or shortness of breath.  Dispense: 8 g; Refill: 0  -Rapid covid and influenza are negative.  -Ordered chest x-ray. Office will call with results.  -Since you have had the symptoms for over a week with no improvement, prescribed Doxycycline 100mg  tablet, 1 tablet twice a day for 7 days; Prednisone 10mg  tablet, 6 day taper, and Albuterol inhaler, 2 puffs every 6 hours as needed for cough or shortness of breath.  -Follow up if not improved or symptoms become worse.   Zandra Abts, NP

## 2024-02-04 NOTE — Patient Instructions (Addendum)
-  Rapid covid and influenza are negative.  -Ordered chest x-ray. Office will call with results.  -Since you have had the symptoms for over a week with no improvement, prescribed Doxycycline 100mg  tablet, 1 tablet twice a day for 7 days; Prednisone 10mg  tablet, 6 day taper, and Albuterol inhaler, 2 puffs every 6 hours as needed for cough or shortness of breath.  -Follow up if not improved or symptoms become worse.

## 2024-02-04 NOTE — Telephone Encounter (Signed)
Patient was seen today.

## 2024-02-09 ENCOUNTER — Telehealth: Payer: Self-pay | Admitting: *Deleted

## 2024-02-09 NOTE — Telephone Encounter (Signed)
Left message on machine for xray results.

## 2024-02-09 NOTE — Telephone Encounter (Signed)
-----   Message from Zandra Abts sent at 02/09/2024 11:20 AM EST ----- Can we please call radiology to find out about the results of the x-ray? Thank you. ----- Message ----- From: SYSTEM Sent: 02/09/2024  12:18 AM EST To: [name], NP

## 2024-02-10 ENCOUNTER — Other Ambulatory Visit: Payer: Self-pay | Admitting: Family Medicine

## 2024-02-10 DIAGNOSIS — R059 Cough, unspecified: Secondary | ICD-10-CM

## 2024-02-10 DIAGNOSIS — J069 Acute upper respiratory infection, unspecified: Secondary | ICD-10-CM

## 2024-02-12 ENCOUNTER — Encounter: Payer: Self-pay | Admitting: Family Medicine

## 2024-02-12 NOTE — Progress Notes (Signed)
Called radiology and the xray results should be in by the end of today or by tomorrow.

## 2024-02-12 NOTE — Progress Notes (Signed)
 Called pt and left voicemail.

## 2024-03-18 ENCOUNTER — Ambulatory Visit (INDEPENDENT_AMBULATORY_CARE_PROVIDER_SITE_OTHER): Payer: Medicare (Managed Care) | Admitting: Family Medicine

## 2024-03-18 ENCOUNTER — Encounter: Payer: Self-pay | Admitting: Family Medicine

## 2024-03-18 VITALS — BP 130/80 | HR 59 | Temp 98.1°F | Ht 65.0 in | Wt 165.0 lb

## 2024-03-18 DIAGNOSIS — E039 Hypothyroidism, unspecified: Secondary | ICD-10-CM | POA: Diagnosis not present

## 2024-03-18 DIAGNOSIS — E871 Hypo-osmolality and hyponatremia: Secondary | ICD-10-CM

## 2024-03-18 DIAGNOSIS — Z1211 Encounter for screening for malignant neoplasm of colon: Secondary | ICD-10-CM | POA: Diagnosis not present

## 2024-03-18 DIAGNOSIS — D649 Anemia, unspecified: Secondary | ICD-10-CM | POA: Diagnosis not present

## 2024-03-18 LAB — CBC WITH DIFFERENTIAL/PLATELET
Basophils Absolute: 0 10*3/uL (ref 0.0–0.1)
Basophils Relative: 0.5 % (ref 0.0–3.0)
Eosinophils Absolute: 0.3 10*3/uL (ref 0.0–0.7)
Eosinophils Relative: 4 % (ref 0.0–5.0)
HCT: 37.9 % — ABNORMAL LOW (ref 39.0–52.0)
Hemoglobin: 12.8 g/dL — ABNORMAL LOW (ref 13.0–17.0)
Lymphocytes Relative: 26.9 % (ref 12.0–46.0)
Lymphs Abs: 1.9 10*3/uL (ref 0.7–4.0)
MCHC: 33.8 g/dL (ref 30.0–36.0)
MCV: 98 fl (ref 78.0–100.0)
Monocytes Absolute: 0.8 10*3/uL (ref 0.1–1.0)
Monocytes Relative: 11.6 % (ref 3.0–12.0)
Neutro Abs: 4.1 10*3/uL (ref 1.4–7.7)
Neutrophils Relative %: 57 % (ref 43.0–77.0)
Platelets: 246 10*3/uL (ref 150.0–400.0)
RBC: 3.87 Mil/uL — ABNORMAL LOW (ref 4.22–5.81)
RDW: 13.9 % (ref 11.5–15.5)
WBC: 7.1 10*3/uL (ref 4.0–10.5)

## 2024-03-18 LAB — COMPREHENSIVE METABOLIC PANEL WITH GFR
ALT: 14 U/L (ref 0–53)
AST: 14 U/L (ref 0–37)
Albumin: 3.6 g/dL (ref 3.5–5.2)
Alkaline Phosphatase: 37 U/L — ABNORMAL LOW (ref 39–117)
BUN: 17 mg/dL (ref 6–23)
CO2: 31 meq/L (ref 19–32)
Calcium: 9.9 mg/dL (ref 8.4–10.5)
Chloride: 100 meq/L (ref 96–112)
Creatinine, Ser: 1.05 mg/dL (ref 0.40–1.50)
GFR: 70.42 mL/min (ref >60.00)
Glucose, Bld: 85 mg/dL (ref 70–99)
Potassium: 4.7 meq/L (ref 3.5–5.1)
Sodium: 137 meq/L (ref 135–145)
Total Bilirubin: 0.3 mg/dL (ref 0.2–1.2)
Total Protein: 9.3 g/dL — ABNORMAL HIGH (ref 6.0–8.3)

## 2024-03-18 NOTE — Progress Notes (Signed)
 Established Patient Office Visit   Subjective:  Patient ID: Glenn Lewis, male    DOB: 1951-09-03  Age: 73 y.o. MRN: [id number]  Chief Complaint  Patient presents with   Medical Management of Chronic Issues    HPI Hypothyroid: Chronic. Patient is taking Levothyroxine 50 mcg every morning.  He denies any symptoms of hypothyroid, such as fatigue, chest pain, hair loss. Last TSH was 2.11 on 07/23/2023.   ROS See HPI above     Objective:   BP 130/80   Pulse (!) 59   Temp 98.1 F (36.7 C) (Oral)   Ht 5\' 5"  (1.651 m)   Wt 165 lb (74.8 kg)   SpO2 96%   BMI 27.46 kg/m    Physical Exam Vitals reviewed.  Constitutional:      General: He is not in acute distress.    Appearance: Normal appearance. He is overweight. He is not ill-appearing, toxic-appearing or diaphoretic.  Eyes:     General:        Right eye: No discharge.        Left eye: No discharge.     Conjunctiva/sclera: Conjunctivae normal.  Cardiovascular:     Rate and Rhythm: Normal rate and regular rhythm.     Heart sounds: Normal heart sounds. No murmur heard.    No friction rub. No gallop.  Pulmonary:     Effort: Pulmonary effort is normal. No respiratory distress.     Breath sounds: Normal breath sounds.  Musculoskeletal:        General: Normal range of motion.  Skin:    General: Skin is warm and dry.  Neurological:     General: No focal deficit present.     Mental Status: He is alert and oriented to person, place, and time. Mental status is at baseline.     Motor: No weakness.     Gait: Gait normal.  Psychiatric:        Mood and Affect: Mood normal.        Behavior: Behavior normal.        Thought Content: Thought content normal.        Judgment: Judgment normal.      Assessment & Plan:  Hypothyroidism, unspecified type Assessment & Plan: Stable. Continue Levothyroxine tablet daily. Ordered TSH. Will change dose accordingly to TSH level.   Orders: -     TSH  Colon cancer screening -      Ambulatory referral to Gastroenterology  Hyponatremia -     Comprehensive metabolic panel with GFR  Anemia, unspecified type -     CBC with Differential/Platelet   1.Review health maintenance:  -Colonoscopy: Referral to GI  -Influenza vaccine: Declines  -Covid booster: Declines  2.Ordered CBC and CMP for previous abnormalities. Lab Results  Component Value Date   WBC 6.3 09/17/2023   HGB 12.7 (L) 09/17/2023   HCT 39.0 09/17/2023   MCV 99 (H) 09/17/2023   PLT 209 09/17/2023     Chemistry      Component Value Date/Time   NA 134 (L) 09/17/2023 1448   NA 136 10/06/2019 1625   K 4.0 09/17/2023 1448   CL 98 09/17/2023 1448   CO2 29 09/17/2023 1448   BUN 13 09/17/2023 1448   BUN 13 10/06/2019 1625   CREATININE 0.97 09/17/2023 1448      Component Value Date/Time   CALCIUM 9.2 09/17/2023 1448   ALKPHOS 31 (L) 09/17/2023 1448   AST 19 09/17/2023 1448   ALT  16 09/17/2023 1448   BILITOT 0.4 09/17/2023 1448   BILITOT 0.3 10/06/2019 1625      Return in about 6 months (around 09/18/2024) for physical.   Zandra Abts, NP

## 2024-03-18 NOTE — Patient Instructions (Addendum)
 It was good to see you today.  -Ordered labs. Office will call with lab results and you will see them on MyChart.  -Continue medications.  -Placed a referral to GI. Please call the office or send a MyChart message if you do not receive a phone call or MyChart message about an appointment in 2 weeks.  -Follow up in 6 months for a physical.

## 2024-03-18 NOTE — Assessment & Plan Note (Signed)
 Stable. Continue Levothyroxine tablet daily. Ordered TSH. Will change dose accordingly to TSH level.

## 2024-03-19 ENCOUNTER — Encounter: Payer: Self-pay | Admitting: Family Medicine

## 2024-03-19 LAB — TSH: TSH: 1.66 u[IU]/mL (ref 0.35–5.50)

## 2024-03-31 ENCOUNTER — Encounter: Payer: Self-pay | Admitting: Internal Medicine

## 2024-04-26 ENCOUNTER — Ambulatory Visit (AMBULATORY_SURGERY_CENTER): Payer: Medicare (Managed Care)

## 2024-04-26 VITALS — Ht 65.0 in | Wt 161.2 lb

## 2024-04-26 DIAGNOSIS — Z860100 Personal history of colon polyps, unspecified: Secondary | ICD-10-CM

## 2024-04-26 MED ORDER — NA SULFATE-K SULFATE-MG SULF 17.5-3.13-1.6 GM/177ML PO SOLN
1.0000 | Freq: Once | ORAL | 0 refills | Status: AC
Start: 2024-04-26 — End: 2024-04-26

## 2024-04-26 NOTE — Progress Notes (Signed)
 No egg or soy allergy known to patient  No issues known to pt with past sedation with any surgeries or procedures Patient denies ever being told they had issues or difficulty with intubation  No FH of Malignant Hyperthermia Pt is not on diet pills Pt is not on  home 02  Pt is not on blood thinners  Pt denies issues with constipation  No A fib or A flutter Have any cardiac testing pending--NO Pt can ambulate- Ambulate independently Pt denies use of chewing tobacco Discussed diabetic I weight loss medication holds Discussed NSAID holds Checked BMI Pt instructed to use Single[employer] or GoodRx for a price reduction on prep  Patient's chart reviewed by Rogena Class CNRA prior to previsit and patient appropriate for the [facility].  Pre visit completed and red dot placed by patient's name on their procedure day (on provider's schedule).      [facility] Interpreter present for in person pre-visit.

## 2024-04-27 ENCOUNTER — Encounter: Payer: Self-pay | Admitting: Internal Medicine

## 2024-05-10 ENCOUNTER — Encounter: Payer: Medicare (Managed Care) | Admitting: Internal Medicine

## 2024-09-21 ENCOUNTER — Ambulatory Visit: Payer: Medicare (Managed Care) | Admitting: Family Medicine

## 2024-09-21 ENCOUNTER — Encounter: Payer: Self-pay | Admitting: Family Medicine

## 2024-09-21 VITALS — BP 112/70 | HR 59 | Temp 97.6°F | Ht 65.0 in | Wt 165.0 lb

## 2024-09-21 DIAGNOSIS — H9312 Tinnitus, left ear: Secondary | ICD-10-CM

## 2024-09-21 DIAGNOSIS — R5383 Other fatigue: Secondary | ICD-10-CM

## 2024-09-21 DIAGNOSIS — E663 Overweight: Secondary | ICD-10-CM

## 2024-09-21 DIAGNOSIS — Z0000 Encounter for general adult medical examination without abnormal findings: Secondary | ICD-10-CM

## 2024-09-21 DIAGNOSIS — Z862 Personal history of diseases of the blood and blood-forming organs and certain disorders involving the immune mechanism: Secondary | ICD-10-CM | POA: Diagnosis not present

## 2024-09-21 DIAGNOSIS — H9192 Unspecified hearing loss, left ear: Secondary | ICD-10-CM

## 2024-09-21 DIAGNOSIS — E039 Hypothyroidism, unspecified: Secondary | ICD-10-CM | POA: Diagnosis not present

## 2024-09-21 DIAGNOSIS — Z23 Encounter for immunization: Secondary | ICD-10-CM

## 2024-09-21 NOTE — Patient Instructions (Addendum)
 It was great to see you today! Today we: -Completed a Physical Exam and reviewed health maintenance. -Continue all medications. -Referral placed to Audiology. They should reach out to schedule an appointment within 2 weeks, if not please let the provider know. -Administered Influenza vaccine. -Ordered labs: CBC, CMP, TSH, lipids, Iron panel, A1c, Vit D, Vit B12. -Will need to make  Medicare Annual Wellness visit in office and 6 month chronic management.  Follow up scheduled for other symptoms for tomorrow at 7am.

## 2024-09-21 NOTE — Progress Notes (Signed)
 Complete physical exam  Patient: Glenn Lewis   DOB: 01/29/1951   73 y.o. Male  MRN: [id number]  Subjective:    Chief Complaint  Patient presents with   Annual Exam   Glenn Lewis is a 73 y.o. male who presents today for a complete physical exam. He reports consuming a general diet. He exercises daily. He generally feels well. He reports sleeping fairly well. Reports feeling more tired than usual. He does not have additional problems to discuss today.   Most recent fall risk assessment:    09/21/2024    7:13 AM  Fall Risk   Falls in the past year? 0  Number falls in past yr: 0  Injury with Fall? 0  Risk for fall due to : No Fall Risks  Follow up Falls evaluation completed   Most recent depression screenings:    09/21/2024    7:13 AM 07/16/2023    7:46 AM  PHQ 2/9 Scores  PHQ - 2 Score 0 0  PHQ- 9 Score 0 3   Vision:Within last year and Dental: No current dental problems and Receives regular dental care  Patient Active Problem List   Diagnosis Date Noted   History of colonic polyps 07/21/2023   Mixed hyperlipidemia 02/11/2023   Hypothyroidism 02/11/2023   Deviated nasal septum 02/09/2020   Closed fracture of nasal bones 02/09/2020   Diverticulosis of colon without hemorrhage 09/12/2017   Adrenal mass 09/12/2017   Patient Care Team: Billy Philippe JONELLE, NP as PCP - General (Family Medicine)   Outpatient Medications Prior to Visit  Medication Sig   Ascorbic Acid (VITAMIN C) 1000 MG tablet Take 1,000 mg by mouth daily.   levothyroxine  (SYNTHROID ) 50 MCG tablet Take 1 tablet (50 mcg total) by mouth daily.   MAGNESIUM GLYCINATE PLUS PO Take 2 tablets by mouth daily. It has Vitamin D3, B6, and Black Pepper   Misc Natural Products (PUMPKIN SEED OIL PO) Take 2 tablets by mouth daily.   POTASSIUM GLUCONATE PO Take 2 tablets by mouth daily.   TURMERIC-GINGER PO Take 1,000 mg by mouth daily at 6 (six) AM.   Vitamin D-Vitamin K (VITAMIN K2-VITAMIN D3 PO) Take 1 tablet by  mouth daily.   Zinc Gluconate POWD 1 Scoop by Does not apply route daily.   No facility-administered medications prior to visit.   Review of Systems  Constitutional:  Positive for malaise/fatigue.  HENT:  Positive for hearing loss and tinnitus. Negative for ear discharge and ear pain.        Some decreased hearing and c/o ringing in the Left Ear  Eyes: Negative.   Respiratory: Negative.    Cardiovascular: Negative.   Gastrointestinal: Negative.   Genitourinary: Negative.   Musculoskeletal: Negative.   Skin: Negative.   Neurological: Negative.   Endo/Heme/Allergies: Negative.   Psychiatric/Behavioral: Negative.       Objective:    BP 112/70   Pulse (!) 59   Temp 97.6 F (36.4 C) (Oral)   Ht 5' 5 (1.651 m)   Wt 165 lb (74.8 kg)   SpO2 96%   BMI 27.46 kg/m  BP Readings from Last 3 Encounters:  09/21/24 112/70  03/18/24 130/80  02/04/24 (!) 140/84   Wt Readings from Last 3 Encounters:  09/21/24 165 lb (74.8 kg)  04/26/24 161 lb 3.2 oz (73.1 kg)  03/18/24 165 lb (74.8 kg)   Physical Exam Constitutional:      Appearance: Normal appearance. He is overweight.  HENT:  Head: Normocephalic.     Right Ear: Tympanic membrane, ear canal and external ear normal.     Left Ear: Tympanic membrane, ear canal and external ear normal.     Ears:     Comments: Partial viewing of TM in both due to earwax in the canals    Nose: Nose normal.     Mouth/Throat:     Mouth: Mucous membranes are moist.     Pharynx: Oropharynx is clear.  Eyes:     Extraocular Movements: Extraocular movements intact.     Conjunctiva/sclera: Conjunctivae normal.     Pupils: Pupils are equal, round, and reactive to light.  Cardiovascular:     Rate and Rhythm: Normal rate and regular rhythm.     Pulses: Normal pulses.     Heart sounds: Normal heart sounds.  Pulmonary:     Effort: Pulmonary effort is normal.     Breath sounds: Normal breath sounds.  Abdominal:     General: Abdomen is flat.      Palpations: Abdomen is soft.     Tenderness: There is no abdominal tenderness.  Musculoskeletal:        General: Normal range of motion.     Cervical back: Normal range of motion.  Skin:    General: Skin is warm and dry.  Neurological:     General: No focal deficit present.     Mental Status: He is alert and oriented to person, place, and time. Mental status is at baseline.  Psychiatric:        Mood and Affect: Mood normal.        Behavior: Behavior normal.        Thought Content: Thought content normal.        Judgment: Judgment normal.      Assessment & Plan:    Routine Health Maintenance and Physical Exam Immunization History  Administered Date(s) Administered   INFLUENZA, HIGH DOSE SEASONAL PF 10/23/2017, 09/21/2024   PFIZER(Purple Top)SARS-COV-2 Vaccination 03/17/2020, 04/08/2020, 12/26/2020   PNEUMOCOCCAL CONJUGATE-20 02/11/2023   Tdap 02/04/2020   Zoster Recombinant(Shingrix ) 02/11/2023, 05/14/2023   Health Maintenance  Topic Date Due   Colonoscopy  08/22/2018   Medicare Annual Wellness (AWV)  07/15/2024   COVID-19 Vaccine (4 - 2025-26 season) 08/23/2024   DTaP/Tdap/Td (2 - Td or Tdap) 02/03/2030   Pneumococcal Vaccine: 50+ Years  Completed   Influenza Vaccine  Completed   Hepatitis C Screening  Completed   Zoster Vaccines- Shingrix   Completed   HPV VACCINES  Aged Out   Meningococcal B Vaccine  Aged Out   Colonoscopy: Has a plan to get in [place]. Advised to bring results when procedure is complete. Medicare Annual Wellness: Will get set up with Ms. Rojelio. Influenza: Administered COVID: Denies.  Discussed health benefits of physical activity, and encouraged him to engage in regular exercise appropriate for his age and condition.  Annual physical exam  Hypothyroidism, unspecified type -     TSH  Overweight -     CBC with Differential/Platelet -     Comprehensive metabolic panel with GFR -     Hemoglobin A1c -     Lipid panel  Fatigue, unspecified  type -     CBC with Differential/Platelet -     Comprehensive metabolic panel with GFR -     TSH -     VITAMIN D 25 Hydroxy (Vit-D Deficiency, Fractures) -     Vitamin B12  History of anemia -     CBC with  Differential/Platelet -     Iron, TIBC and Ferritin Panel  Decreased hearing of left ear -     Ambulatory referral to Audiology  Tinnitus of left ear -     Ambulatory referral to Audiology  Immunization due -     Flu vaccine HIGH DOSE PF(Fluzone Trivalent)   -Completed a Physical Exam and reviewed health maintenance. -Continue all medications. -Referral placed to Audiology. They should reach out to schedule an appointment within 2 weeks, if not please let the provider know. -Administered Influenza vaccine. -Ordered labs: CBC, CMP, TSH, lipids, Iron panel, A1c, Vit D, Vit B12. -Needs Medicare Annual Wellness visit with Ms. Rojelio, LPN Follow up scheduled for other symptoms tomorrow at 7am. Needs 6 month chronic.   I personally was present during the history, physical exam, and medical decision-making activities of this service and have verified that the service and findings are accurately documented in the nurse practitioner student's note.    JoAnna Williamson, NP

## 2024-09-22 ENCOUNTER — Ambulatory Visit (INDEPENDENT_AMBULATORY_CARE_PROVIDER_SITE_OTHER): Admitting: Family Medicine

## 2024-09-22 ENCOUNTER — Encounter: Payer: Self-pay | Admitting: Family Medicine

## 2024-09-22 VITALS — BP 116/82 | HR 70 | Temp 97.8°F | Ht 65.0 in | Wt 165.0 lb

## 2024-09-22 DIAGNOSIS — E663 Overweight: Secondary | ICD-10-CM | POA: Diagnosis not present

## 2024-09-22 DIAGNOSIS — E039 Hypothyroidism, unspecified: Secondary | ICD-10-CM | POA: Diagnosis not present

## 2024-09-22 DIAGNOSIS — R5383 Other fatigue: Secondary | ICD-10-CM | POA: Diagnosis not present

## 2024-09-22 DIAGNOSIS — Z862 Personal history of diseases of the blood and blood-forming organs and certain disorders involving the immune mechanism: Secondary | ICD-10-CM

## 2024-09-22 DIAGNOSIS — M25531 Pain in right wrist: Secondary | ICD-10-CM

## 2024-09-22 DIAGNOSIS — K219 Gastro-esophageal reflux disease without esophagitis: Secondary | ICD-10-CM | POA: Diagnosis not present

## 2024-09-22 DIAGNOSIS — R058 Other specified cough: Secondary | ICD-10-CM

## 2024-09-22 LAB — VITAMIN B12: Vitamin B-12: 748 pg/mL (ref 211–911)

## 2024-09-22 LAB — CBC WITH DIFFERENTIAL/PLATELET
Basophils Absolute: 0 K/uL (ref 0.0–0.1)
Basophils Relative: 0.4 % (ref 0.0–3.0)
Eosinophils Absolute: 0.3 K/uL (ref 0.0–0.7)
Eosinophils Relative: 4.1 % (ref 0.0–5.0)
HCT: 37.2 % — ABNORMAL LOW (ref 39.0–52.0)
Hemoglobin: 12.2 g/dL — ABNORMAL LOW (ref 13.0–17.0)
Lymphocytes Relative: 20.1 % (ref 12.0–46.0)
Lymphs Abs: 1.3 K/uL (ref 0.7–4.0)
MCHC: 32.9 g/dL (ref 30.0–36.0)
MCV: 97.9 fl (ref 78.0–100.0)
Monocytes Absolute: 1 K/uL (ref 0.1–1.0)
Monocytes Relative: 14.9 % — ABNORMAL HIGH (ref 3.0–12.0)
Neutro Abs: 3.9 K/uL (ref 1.4–7.7)
Neutrophils Relative %: 60.5 % (ref 43.0–77.0)
Platelets: 212 K/uL (ref 150.0–400.0)
RBC: 3.8 Mil/uL — ABNORMAL LOW (ref 4.22–5.81)
RDW: 14.3 % (ref 11.5–15.5)
WBC: 6.4 K/uL (ref 4.0–10.5)

## 2024-09-22 LAB — LIPID PANEL
Cholesterol: 222 mg/dL — ABNORMAL HIGH (ref 0–200)
HDL: 57 mg/dL (ref >39.00)
LDL Cholesterol: 148 mg/dL — ABNORMAL HIGH (ref 0–99)
NonHDL: 165.32
Total CHOL/HDL Ratio: 4
Triglycerides: 85 mg/dL (ref 0.0–149.0)
VLDL: 17 mg/dL (ref 0.0–40.0)

## 2024-09-22 LAB — COMPREHENSIVE METABOLIC PANEL WITH GFR
ALT: 17 U/L (ref 0–53)
AST: 19 U/L (ref 0–37)
Albumin: 3.3 g/dL — ABNORMAL LOW (ref 3.5–5.2)
Alkaline Phosphatase: 30 U/L — ABNORMAL LOW (ref 39–117)
BUN: 19 mg/dL (ref 6–23)
CO2: 29 meq/L (ref 19–32)
Calcium: 8.9 mg/dL (ref 8.4–10.5)
Chloride: 100 meq/L (ref 96–112)
Creatinine, Ser: 0.85 mg/dL (ref 0.40–1.50)
GFR: 85.89 mL/min (ref >60.00)
Glucose, Bld: 96 mg/dL (ref 70–99)
Potassium: 4.8 meq/L (ref 3.5–5.1)
Sodium: 134 meq/L — ABNORMAL LOW (ref 135–145)
Total Bilirubin: 0.2 mg/dL (ref 0.2–1.2)
Total Protein: 9.5 g/dL — ABNORMAL HIGH (ref 6.0–8.3)

## 2024-09-22 LAB — TSH: TSH: 1.79 u[IU]/mL (ref 0.35–5.50)

## 2024-09-22 LAB — VITAMIN D 25 HYDROXY (VIT D DEFICIENCY, FRACTURES): VITD: 58.69 ng/mL (ref 30.00–100.00)

## 2024-09-22 LAB — HEMOGLOBIN A1C: Hgb A1c MFr Bld: 6.5 % (ref 4.6–6.5)

## 2024-09-22 MED ORDER — OMEPRAZOLE 20 MG PO CPDR: 20.0000 mg | DELAYED_RELEASE_CAPSULE | Freq: Every day | ORAL | 3 refills | Status: DC

## 2024-09-22 MED ORDER — PREDNISONE 10 MG PO TABS: ORAL_TABLET | ORAL | 0 refills | Status: AC

## 2024-09-22 NOTE — Progress Notes (Signed)
 Established Patient Office Visit   Subjective:  Patient ID: Glenn Lewis, male    DOB: 1951-06-11  Age: 73 y.o. MRN: [id number]  Chief Complaint  Patient presents with   Cough        Wrist Pain   Heartburn   Fatigue    Cough Associated symptoms include heartburn.  Wrist Pain   Heartburn He complains of coughing and heartburn.   Patient is complaining of right wrist pain that started about three months ago. Gradually started. Daily pain, described pressure. Pain occurs when grabbing something. Denies recent injury. Denies numbness and tingling.   Patient is complaining of heartburn in mid sternal chest. Usually occurs about 4 times a day. Started when he went to [place], about 2 months ago. He has tried drinking milk, water, and Pepto Bismol with no relief. Usually last 30-60 minutes. He reports it feels like a hot fire. Denies cardiac chest pain, SHOB, nausea, or vomiting.   Patient is complaining of a cough. Cough has been present for about 3 months. Non productive cough. Denies any SHOB, difficulty breathing, fever, or chest pain.    Review of Systems  Respiratory:  Positive for cough.   Gastrointestinal:  Positive for heartburn.   See HPI above     Objective:   BP 116/82   Pulse 70   Temp 97.8 F (36.6 C) (Oral)   Ht 5' 5 (1.651 m)   Wt 165 lb (74.8 kg)   SpO2 98%   BMI 27.46 kg/m    Physical Exam Vitals reviewed.  Constitutional:      General: He is not in acute distress.    Appearance: Normal appearance. He is not ill-appearing, toxic-appearing or diaphoretic.  HENT:     Head: Normocephalic and atraumatic.  Eyes:     General:        Right eye: No discharge.        Left eye: No discharge.     Conjunctiva/sclera: Conjunctivae normal.  Cardiovascular:     Rate and Rhythm: Normal rate and regular rhythm.     Heart sounds: Normal heart sounds. No murmur heard.    No friction rub. No gallop.  Pulmonary:     Effort: Pulmonary effort is normal. No  respiratory distress.     Breath sounds: Normal breath sounds.  Musculoskeletal:     Right wrist: No swelling, deformity or tenderness. Decreased range of motion (Mild). Normal pulse.     Comments: Decrease strength.   Skin:    General: Skin is warm and dry.  Neurological:     General: No focal deficit present.     Mental Status: He is alert and oriented to person, place, and time. Mental status is at baseline.  Psychiatric:        Mood and Affect: Mood normal.        Behavior: Behavior normal.        Thought Content: Thought content normal.        Judgment: Judgment normal.      Assessment & Plan:  Right wrist pain -     predniSONE ; Take 6 tablets (60 mg total) by mouth daily with breakfast for 1 day, THEN 5 tablets (50 mg total) daily with breakfast for 1 day, THEN 4 tablets (40 mg total) daily with breakfast for 1 day, THEN 3 tablets (30 mg total) daily with breakfast for 1 day, THEN 2 tablets (20 mg total) daily with breakfast for 1 day, THEN 1 tablet (10 mg  total) daily with breakfast for 1 day.  Dispense: 21 tablet; Refill: 0  Gastroesophageal reflux disease without esophagitis -     Omeprazole; Take 1 capsule (20 mg total) by mouth daily.  Dispense: 30 capsule; Refill: 3  Dry cough -     Omeprazole; Take 1 capsule (20 mg total) by mouth daily.  Dispense: 30 capsule; Refill: 3 -     predniSONE ; Take 6 tablets (60 mg total) by mouth daily with breakfast for 1 day, THEN 5 tablets (50 mg total) daily with breakfast for 1 day, THEN 4 tablets (40 mg total) daily with breakfast for 1 day, THEN 3 tablets (30 mg total) daily with breakfast for 1 day, THEN 2 tablets (20 mg total) daily with breakfast for 1 day, THEN 1 tablet (10 mg total) daily with breakfast for 1 day.  Dispense: 21 tablet; Refill: 0  -Prescribed Prednisone  10mg  tablet, 6 day tape for cough and right wrist pain. Do not take NSAIDS (Ibuprofen, Advil, Aleve, or Naproxen) while taking medication. May take with food. If wrist  pain is not improved, will refer to ortho.  -Prescribed Omeprazole 20mg  tablet daily. Recommend to take prior to bed. Only drink water 2 hours prior to bed.  -Suspect dry cough is more related to GERD than respiratory.    Return in about 1 month (around 10/23/2024) for follow-up.   Mikisha Roseland, NP

## 2024-09-22 NOTE — Patient Instructions (Addendum)
-  It was good to see you today.  -Prescribed Prednisone  10mg  tablet, 6 day taper. Do not take NSAIDS (Ibuprofen, Advil, Aleve, or Naproxen) while taking medication. May take with food.  -Prescribed Omeprazole 20mg  tablet daily. Recommend to take prior to bed. Only drink water 2 hours prior to bed.  -Follow up in 1 month.

## 2024-09-22 NOTE — Addendum Note (Signed)
 Addended by: Rilyn Scroggs R on: 09/22/2024 07:16 AM   Modules accepted: Orders

## 2024-09-23 ENCOUNTER — Ambulatory Visit: Payer: Self-pay | Admitting: Family Medicine

## 2024-09-23 DIAGNOSIS — R7309 Other abnormal glucose: Secondary | ICD-10-CM

## 2024-09-23 LAB — IRON,TIBC AND FERRITIN PANEL
%SAT: 27 % (ref 20–48)
Ferritin: 173 ng/mL (ref 24–380)
Iron: 79 ug/dL (ref 50–180)
TIBC: 289 ug/dL (ref 250–425)

## 2024-10-06 ENCOUNTER — Ambulatory Visit (INDEPENDENT_AMBULATORY_CARE_PROVIDER_SITE_OTHER): Admitting: Family Medicine

## 2024-10-06 ENCOUNTER — Encounter: Payer: Self-pay | Admitting: Family Medicine

## 2024-10-06 VITALS — BP 130/82 | HR 62 | Temp 97.8°F | Ht 65.0 in | Wt 166.0 lb

## 2024-10-06 DIAGNOSIS — K219 Gastro-esophageal reflux disease without esophagitis: Secondary | ICD-10-CM | POA: Diagnosis not present

## 2024-10-06 DIAGNOSIS — M25531 Pain in right wrist: Secondary | ICD-10-CM | POA: Diagnosis not present

## 2024-10-06 NOTE — Progress Notes (Signed)
   Established Patient Office Visit   Subjective:  Patient ID: Glenn Lewis, male    DOB: Oct 06, 1951  Age: 73 y.o. MRN: [id number]  Chief Complaint  Patient presents with   Wrist Pain    HPI: At past visit, he was complaining of right wrist pain that started about three months ago. Gradually started. Daily pain, described pressure. Pain occurs when grabbing something. Denies recent injury. Denies numbness and tingling. He reports he has previously seen orthopedic in [place]. Requesting a steroid injection. Tried Prednisone  taper back, but not effective.   Also, reports dry cough and heartburn have improved with Omeprazole daily.  ROS See HPI above     Objective:   BP 130/82   Pulse 62   Temp 97.8 F (36.6 C) (Oral)   Ht 5' 5 (1.651 m)   Wt 166 lb (75.3 kg)   SpO2 97%   BMI 27.62 kg/m    Physical Exam Vitals reviewed.  Constitutional:      General: He is not in acute distress.    Appearance: Normal appearance. He is not ill-appearing, toxic-appearing or diaphoretic.  Eyes:     General:        Right eye: No discharge.        Left eye: No discharge.     Conjunctiva/sclera: Conjunctivae normal.  Cardiovascular:     Rate and Rhythm: Normal rate.  Pulmonary:     Effort: Pulmonary effort is normal. No respiratory distress.  Musculoskeletal:        General: Normal range of motion.     Comments: Wearing right ankle brace   Skin:    General: Skin is warm and dry.  Neurological:     General: No focal deficit present.     Mental Status: He is alert and oriented to person, place, and time. Mental status is at baseline.  Psychiatric:        Mood and Affect: Mood normal.        Behavior: Behavior normal.        Thought Content: Thought content normal.        Judgment: Judgment normal.       Assessment & Plan:  Right wrist pain -     Ambulatory referral to Orthopedic Surgery -     DG Wrist Complete Right; Future  Gastroesophageal reflux disease without  esophagitis  -Ordered right wrist x-ray. Please schedule an appointment to have x-ray completed on Friday when radiology is available.  -Placed a referral to orthopedic for right wrist pain. Please call the office or send a MyChart message if you do not receive a phone call or a MyChart message about appointment in 2 weeks.  -Cough and gastric reflux has improved with Omeprazole. Continue medication.  -Follow up as usual every 6 months.   Glenn Wauters, NP

## 2024-10-06 NOTE — Patient Instructions (Signed)
-  It was good to see you! -Ordered right wrist x-ray. Please schedule an appointment to have x-ray completed on Friday when radiology is available.  -Placed a referral to orthopedic for right wrist pain. Please call the office or send a MyChart message if you do not receive a phone call or a MyChart message about appointment in 2 weeks.  -Cough and gastric reflux has improved with Omeprazole. Continue medication.  -Follow up as usual every 6 months.

## 2024-10-08 ENCOUNTER — Other Ambulatory Visit

## 2024-10-08 ENCOUNTER — Ambulatory Visit

## 2024-10-08 DIAGNOSIS — M8568 Other cyst of bone, other site: Secondary | ICD-10-CM | POA: Diagnosis not present

## 2024-10-08 DIAGNOSIS — M25531 Pain in right wrist: Secondary | ICD-10-CM

## 2024-10-12 ENCOUNTER — Ambulatory Visit: Admitting: Orthopedic Surgery

## 2024-10-12 DIAGNOSIS — M1811 Unilateral primary osteoarthritis of first carpometacarpal joint, right hand: Secondary | ICD-10-CM | POA: Diagnosis not present

## 2024-10-12 MED ORDER — BETAMETHASONE SOD PHOS & ACET 6 (3-3) MG/ML IJ SUSP
6.0000 mg | INTRAMUSCULAR | Status: AC | PRN
  Administered 2024-10-12: 6 mg via INTRA_ARTICULAR

## 2024-10-12 MED ORDER — LIDOCAINE HCL 1 % IJ SOLN
1.0000 mL | INTRAMUSCULAR | Status: AC | PRN
  Administered 2024-10-12: 1 mL

## 2024-10-12 NOTE — Progress Notes (Signed)
 Glenn Lewis - 73 y.o. male MRN [id number]  Date of birth: 08-12-1951  Office Visit Note: Visit Date: 10/12/2024 PCP: Billy Philippe JONELLE, NP Referred by: Billy Philippe JONELLE, NP  Subjective: No chief complaint on file.  HPI: Glenn Lewis is a pleasant 73 y.o. male who presents today for evaluation of ongoing right wrist pain and thumb basilar joint pain.  He has history notable for prior proximal row carpectomy performed approximately 15 years prior to an outside facility.  Is currently been managing his pain with bracing and activity modification.  He is overall active at baseline, works in [employer] doing a fair amount of heavy lifting.  Pertinent ROS were reviewed with the patient and found to be negative unless otherwise specified above in HPI.   Visit Reason: right wrist, thumb basilar joint pain Duration of symptoms:years  Hand dominance: right Occupation: Restaurant  Diabetic: No Smoking: No Heart/Lung History: none Blood Thinners:  none  Prior Testing/EMG: xrays 10/08/24 Injections (Date): none Treatments: brace Prior Surgery: none    Assessment & Plan: Visit Diagnoses:  1. Arthritis of carpometacarpal (CMC) joint of right thumb     Plan: Extensive discussion was had with patient today regarding his ongoing right wrist and basilar thumb pain.  He does have some evidence of CMC arthritis seen both clinically and radiographically.  I did explain to him that in the setting of a previous proximal row carpectomy, he may have some ongoing degeneration between the trapezium and the thumb metacarpal region that may warrant further treatment in the future.  For the time being, for clinical diagnostic and therapeutic purposes, we can move forward with cortisone injection to the right thumb CMC region.  Risk and benefits of the injection were discussed in detail with the patient today, he elected to proceed.  Injection was performed today without complaint.  He continue with  the current brace that he is in for the wrist and thumb.  Follow-up in approximately 6 weeks for recheck.  Follow-up: No follow-ups on file.   Meds & Orders: No orders of the defined types were placed in this encounter.   Orders Placed This Encounter  Procedures   Hand/UE Inj     Procedures: Hand/UE Inj: R thumb CMC for osteoarthritis on 10/12/2024 12:36 PM Indications: pain Details: 25 G needle Medications: 1 mL lidocaine 1 %; 6 mg betamethasone acetate-betamethasone sodium phosphate 6 (3-3) MG/ML Outcome: tolerated well, no immediate complications Procedure, treatment alternatives, risks and benefits explained, specific risks discussed.          Clinical History: No specialty comments available.  He reports that he has never smoked. He has never used smokeless tobacco.  Recent Labs    09/22/24 0744  HGBA1C 6.5    Objective:   Vital Signs: There were no vitals taken for this visit.  Physical Exam  Gen: Well-appearing, in no acute distress; non-toxic CV: Regular Rate. Well-perfused. Warm.  Resp: Breathing unlabored on room air; no wheezing. Psych: Fluid speech in conversation; appropriate affect; normal thought process  Ortho Exam General: Patient is well appearing and in no distress.    Skin and Muscle: Prior well-healed dorsal incision over the right wrist.  Muscle bulk and contour normal, no signs of atrophy.      Range of Motion and Palpation Tests: Mobility is full about the elbows with flexion and extension.  Range of motion of the right wrist is limited, flexion/extension 25/15, composite fist without significant restriction.    No  cords or nodules are palpated.  No triggering is observed.     Significant tenderness over the right thumb CMC articulation is observed, positive grind for pain, positive crepitus.  MP hyperextension negative.    Finklestein test mildly positive   Neurologic, Vascular, Motor: Sensation is intact to light touch in the  median/radial/ulnar distributions.  Fingers pink and well perfused.  Capillary refill is brisk.      Imaging: No results found.  Past Medical/Family/Surgical/Social History: Medications & Allergies reviewed per EMR, new medications updated. Patient Active Problem List   Diagnosis Date Noted   History of colonic polyps 07/21/2023   Mixed hyperlipidemia 02/11/2023   Hypothyroidism 02/11/2023   Deviated nasal septum 02/09/2020   Closed fracture of nasal bones 02/09/2020   Diverticulosis of colon without hemorrhage 09/12/2017   Adrenal mass 09/12/2017   Past Medical History:  Diagnosis Date   Arthritis    BPH (benign prostatic hyperplasia)    Hyperlipidemia    Hypothyroidism    Family History  Problem Relation Age of Onset   Hypertension Mother    Allergies Mother    Kidney Stones Father    Cancer Sister    Hyperthyroidism Sister    Kidney Stones Brother    Headache Son    Colon cancer Neg Hx    Esophageal cancer Neg Hx    Stomach cancer Neg Hx    Rectal cancer Neg Hx    Past Surgical History:  Procedure Laterality Date   CARPAL TUNNEL RELEASE Bilateral    Dental implants  2020   Upper and lower   Social History   Occupational History   Occupation: Cook  Tobacco Use   Smoking status: Never   Smokeless tobacco: Never  Vaping Use   Vaping status: Never Used  Substance and Sexual Activity   Alcohol use: Not Currently   Drug use: No   Sexual activity: Yes    Breken Nazari Afton Alderton, M.D. [facility], Hand Surgery

## 2024-10-25 ENCOUNTER — Encounter: Payer: Self-pay | Admitting: Radiology

## 2024-11-07 DIAGNOSIS — R051 Acute cough: Secondary | ICD-10-CM | POA: Diagnosis not present

## 2024-11-11 DIAGNOSIS — R059 Cough, unspecified: Secondary | ICD-10-CM | POA: Diagnosis not present

## 2024-11-19 DIAGNOSIS — J40 Bronchitis, not specified as acute or chronic: Secondary | ICD-10-CM | POA: Diagnosis not present

## 2024-11-19 DIAGNOSIS — J329 Chronic sinusitis, unspecified: Secondary | ICD-10-CM | POA: Diagnosis not present

## 2024-11-23 ENCOUNTER — Ambulatory Visit: Admitting: Orthopedic Surgery

## 2024-11-23 DIAGNOSIS — M1811 Unilateral primary osteoarthritis of first carpometacarpal joint, right hand: Secondary | ICD-10-CM

## 2024-11-23 NOTE — Progress Notes (Unsigned)
 Right thumb CMC follow up; injection 10/12/24

## 2024-12-07 ENCOUNTER — Encounter: Payer: Self-pay | Admitting: Family Medicine

## 2024-12-07 ENCOUNTER — Ambulatory Visit: Admitting: Family Medicine

## 2024-12-07 VITALS — BP 136/84 | HR 66 | Temp 97.8°F | Ht 65.0 in | Wt 163.0 lb

## 2024-12-07 DIAGNOSIS — R051 Acute cough: Secondary | ICD-10-CM

## 2024-12-07 MED ORDER — PREDNISONE 10 MG PO TABS: ORAL_TABLET | ORAL | 0 refills | Status: AC

## 2024-12-07 NOTE — Progress Notes (Signed)
° °  Established Patient Office Visit   Subjective:  Patient ID: Glenn Lewis, male    DOB: 11-12-1951  Age: 73 y.o. MRN: [id number]  Chief Complaint  Patient presents with   Cough   HPI: Patient is complaining of a dry cough with no other symptoms.  He was seen with [facility] on 11/16 for cough. Negative for covid, influenza, and RSV. He was prescribed Azithromycin and Prednisone  20mg  tablet 5 days. He returned back on 11/20 with no improvement. Chest x-ray completed with no acute abnormalities. Then, on 11/28 he was seen once more for cough and dx with bacterial sinusitis. Prescribed Amoxicillin-Clavulanate 500-125mg  BID 7 days. Also, he has tried Mucinex with minimal relief.  Review of Systems  Respiratory:  Positive for cough.    See HPI above     Objective:   BP 136/84   Pulse 66   Temp 97.8 F (36.6 C) (Oral)   Ht 5' 5 (1.651 m)   Wt 163 lb (73.9 kg)   SpO2 95%   BMI 27.12 kg/m    Physical Exam Vitals reviewed.  Constitutional:      General: He is not in acute distress.    Appearance: Normal appearance. He is not ill-appearing, toxic-appearing or diaphoretic.  Eyes:     General:        Right eye: No discharge.        Left eye: No discharge.     Conjunctiva/sclera: Conjunctivae normal.  Cardiovascular:     Rate and Rhythm: Normal rate and regular rhythm.     Heart sounds: Normal heart sounds. No murmur heard.    No friction rub. No gallop.  Pulmonary:     Effort: Pulmonary effort is normal. No respiratory distress.     Breath sounds: Normal breath sounds.     Comments: No cough during visit.  Musculoskeletal:        General: Normal range of motion.  Skin:    General: Skin is warm and dry.  Neurological:     General: No focal deficit present.     Mental Status: He is alert and oriented to person, place, and time. Mental status is at baseline.  Psychiatric:        Mood and Affect: Mood normal.        Behavior: Behavior normal.        Thought  Content: Thought content normal.        Judgment: Judgment normal.      Assessment & Plan:  Acute cough -     predniSONE ; Take 6 tablets (60 mg total) by mouth daily with breakfast for 1 day, THEN 5 tablets (50 mg total) daily with breakfast for 1 day, THEN 4 tablets (40 mg total) daily with breakfast for 1 day, THEN 3 tablets (30 mg total) daily with breakfast for 1 day, THEN 2 tablets (20 mg total) daily with breakfast for 1 day, THEN 1 tablet (10 mg total) daily with breakfast for 1 day.  Dispense: 21 tablet; Refill: 0  -Prescribed Prednisone  10mg , 6 day taper for cough. -Suspect you had a viral infection that has a caused a lingering cough. -If not improved, follow up.   Mirtie Bastyr, NP

## 2024-12-07 NOTE — Patient Instructions (Addendum)
-  It was great to see you today.  -Prescribed Prednisone  10mg , 6 day taper for cough. -Suspect you had a viral infection that has a caused a lingering cough. -If not improved, follow up.  Neomia Christmas!

## 2024-12-18 ENCOUNTER — Other Ambulatory Visit: Payer: Self-pay | Admitting: Family Medicine

## 2024-12-18 DIAGNOSIS — R058 Other specified cough: Secondary | ICD-10-CM

## 2024-12-18 DIAGNOSIS — K219 Gastro-esophageal reflux disease without esophagitis: Secondary | ICD-10-CM

## 2024-12-24 ENCOUNTER — Other Ambulatory Visit

## 2025-01-26 ENCOUNTER — Ambulatory Visit: Admitting: Family Medicine

## 2025-01-28 ENCOUNTER — Ambulatory Visit: Payer: Self-pay

## 2025-01-28 ENCOUNTER — Ambulatory Visit: Admitting: Family Medicine

## 2025-01-28 ENCOUNTER — Encounter: Payer: Self-pay | Admitting: Family Medicine

## 2025-01-28 VITALS — BP 120/70 | HR 70 | Ht 65.0 in | Wt 169.2 lb

## 2025-01-28 DIAGNOSIS — R350 Frequency of micturition: Secondary | ICD-10-CM

## 2025-01-28 DIAGNOSIS — R42 Dizziness and giddiness: Secondary | ICD-10-CM

## 2025-01-28 LAB — CBC WITH DIFFERENTIAL/PLATELET
Basophils Absolute: 0 10*3/uL (ref 0.0–0.1)
Basophils Relative: 0.4 % (ref 0.0–3.0)
Eosinophils Absolute: 0.2 10*3/uL (ref 0.0–0.7)
Eosinophils Relative: 4.1 % (ref 0.0–5.0)
HCT: 37.3 % — ABNORMAL LOW (ref 39.0–52.0)
Hemoglobin: 12.4 g/dL — ABNORMAL LOW (ref 13.0–17.0)
Lymphocytes Relative: 30.3 % (ref 12.0–46.0)
Lymphs Abs: 1.8 10*3/uL (ref 0.7–4.0)
MCHC: 33.2 g/dL (ref 30.0–36.0)
MCV: 97.8 fl (ref 78.0–100.0)
Monocytes Absolute: 0.6 10*3/uL (ref 0.1–1.0)
Monocytes Relative: 10.3 % (ref 3.0–12.0)
Neutro Abs: 3.3 10*3/uL (ref 1.4–7.7)
Neutrophils Relative %: 54.9 % (ref 43.0–77.0)
Platelets: 221 10*3/uL (ref 150.0–400.0)
RBC: 3.81 Mil/uL — ABNORMAL LOW (ref 4.22–5.81)
RDW: 13.8 % (ref 11.5–15.5)
WBC: 6 10*3/uL (ref 4.0–10.5)

## 2025-01-28 LAB — BASIC METABOLIC PANEL WITH GFR
BUN: 22 mg/dL (ref 6–23)
CO2: 29 meq/L (ref 19–32)
Calcium: 9.3 mg/dL (ref 8.4–10.5)
Chloride: 101 meq/L (ref 96–112)
Creatinine, Ser: 1.03 mg/dL (ref 0.40–1.50)
GFR: 71.62 mL/min
Glucose, Bld: 87 mg/dL (ref 70–99)
Potassium: 4.7 meq/L (ref 3.5–5.1)
Sodium: 137 meq/L (ref 135–145)

## 2025-01-28 LAB — POCT URINALYSIS DIPSTICK
Bilirubin, UA: NEGATIVE
Blood, UA: NEGATIVE
Glucose, UA: NEGATIVE
Ketones, UA: NEGATIVE
Leukocytes, UA: NEGATIVE
Nitrite, UA: NEGATIVE
Protein, UA: POSITIVE — AB
Spec Grav, UA: 1.025
Urobilinogen, UA: 0.2 U/dL
pH, UA: 6

## 2025-01-28 LAB — TSH: TSH: 3.51 u[IU]/mL (ref 0.35–5.50)

## 2025-01-28 NOTE — Telephone Encounter (Signed)
 FYI Only or Action Required?: FYI only for provider: appointment scheduled on 01/28/25.  Patient was last seen in primary care on 12/07/2024 by Billy Philippe SAUNDERS, NP.  Called Nurse Triage reporting Urinary Frequency.  Symptoms began a week ago.  Interventions attempted: Nothing.  Symptoms are: unchanged.  Triage Disposition: See HCP Within 4 Hours (Or PCP Triage)  Patient/caregiver understands and will follow disposition?: Yes   Message from [place] sent at 01/28/2025 11:09 AM EST  Reason for Triage: Patients states when he urinates there is a lot of foam, has increased frequency but is difficult to go.     Reason for Disposition  Side (flank) or lower back pain present  Answer Assessment - Initial Assessment Questions Scheduled appt 01/28/25  Advised call back or ED/911 if symptoms occur/worsen: severe diff breathing, chest pain > 5 min, faint.  Severe HA, changes vision, confusion, diff speech/ walking, numbness/weakness one side of body. ED not able void 4 hours.Patient verbalized understanding.  1. SYMPTOM: What's the main symptom you're concerned about? (e.g., frequency, incontinence)    Urine foamy,  light stream, wait for urine to come out Last urination 30 minutes ago 2. ONSET: When did the    start?     2 months 3. PAIN: Is there any pain? If Yes, ask: How bad is it? (Scale: 1-10; mild, moderate, severe)     No pain urination, flank bilateral pain, 2/10 4. CAUSE: What do you think is causing the symptoms?     unsure 5. OTHER SYMPTOMS: Do you have any other symptoms? (e.g., blood in urine, fever, flank pain, pain with urination)     Denies difficulty breath, chest pain, abd pain, faint, blood/odor urine, fever chills, n/v  Dizziness intermittent  Answer Assessment - Initial Assessment Questions 1. DESCRIPTION: Describe your dizziness.     Dizziness while at work, walking around; last episode last night 2. LIGHTHEADED: Do you feel lightheaded?  (e.g., somewhat faint, woozy, weak upon standing)     Just feel like may loose balance 3. VERTIGO: Do you feel like either you or the room is spinning or tilting? (i.e., vertigo)     no 4. SEVERITY: How bad is it?  Do you feel like you are going to faint? Can you stand and walk?     yes 5. ONSET:  When did the dizziness begin?     Year ago 6. AGGRAVATING FACTORS: Does anything make it worse? (e.g., standing, change in head position)     Water flushed feels better  8. CAUSE: What do you think is causing the dizziness? (e.g., decreased fluids or food, diarrhea, emotional distress, heat exposure, new medicine, sudden standing, vomiting; unknown)     usnure 9. RECURRENT SYMPTOM: Have you had dizziness before? If Yes, ask: When was the last time? What happened that time?     yes 10. OTHER SYMPTOMS: Do you have any other symptoms? (e.g., fever, chest pain, vomiting, diarrhea, bleeding)      denies  Protocols used: Urinary Symptoms-A-AH, Dizziness - Lightheadedness-A-AH

## 2025-01-28 NOTE — Telephone Encounter (Signed)
 FYI - patient seeing you today at 1pm

## 2025-01-28 NOTE — Progress Notes (Unsigned)
" ° °  Subjective:    Patient ID: Glenn Lewis, male    DOB: 01-Nov-1951, 74 y.o.   MRN: [id number]  HPI Urinary frequency- pt reports increased frequency but he states he drinks a lot of water.  Pt states he has a large prostate.  No discomfort w/ urinating.  Does have hesitancy.  Notes some bubbles and foam when urinating.    Lightheaded- sxs started ~2 months ago.  Episodic but occurring almost daily.  Mostly later in the day.  States he will feel weak, off balance.  Feels somewhat better after splashing water on face and head.  Denies feeling sweaty or hot.  Does not necessarily occur w/ position changes.  Does not eat much throughout the day- has breakfast daily and will snack on fruit.   Review of Systems For ROS see HPI     Objective:   Physical Exam        Assessment & Plan:    "

## 2025-01-28 NOTE — Patient Instructions (Addendum)
 Follow up as needed or as scheduled with your PCP We'll notify you of your lab results and make any changes if needed Continue to drink LOTS of water Try eating more regularly throughout the day to avoid your blood sugar from dropping.  Protein is VERY important Call with any questions or concerns Have a great weekend!
